# Patient Record
Sex: Female | Born: 1935 | Race: White | Hispanic: No | Smoking: Former smoker
Health system: Southern US, Community
[De-identification: ages and names within clinical notes are randomized; demographics above are authoritative.]

## PROBLEM LIST (undated history)

## (undated) DIAGNOSIS — M129 Arthropathy, unspecified: Secondary | ICD-10-CM

## (undated) DIAGNOSIS — H3530 Unspecified macular degeneration: Secondary | ICD-10-CM

## (undated) DIAGNOSIS — M810 Age-related osteoporosis without current pathological fracture: Secondary | ICD-10-CM

## (undated) DIAGNOSIS — J869 Pyothorax without fistula: Secondary | ICD-10-CM

## (undated) DIAGNOSIS — L409 Psoriasis, unspecified: Secondary | ICD-10-CM

## (undated) DIAGNOSIS — M1990 Unspecified osteoarthritis, unspecified site: Secondary | ICD-10-CM

## (undated) HISTORY — PX: MOUTH SURGERY: SHX715

## (undated) HISTORY — PX: OTHER SURGICAL HISTORY: SHX169

## (undated) HISTORY — DX: Arthropathy, unspecified: M12.9

## (undated) HISTORY — DX: Unspecified macular degeneration: H35.30

## (undated) HISTORY — DX: Age-related osteoporosis without current pathological fracture: M81.0

## (undated) HISTORY — DX: Pyothorax without fistula: J86.9

## (undated) HISTORY — DX: Psoriasis, unspecified: L40.9

## (undated) HISTORY — DX: Unspecified osteoarthritis, unspecified site: M19.90

---

## 1999-01-22 ENCOUNTER — Encounter: Payer: Self-pay | Admitting: Family Medicine

## 1999-01-22 ENCOUNTER — Ambulatory Visit (HOSPITAL_COMMUNITY): Admission: RE | Admit: 1999-01-22 | Discharge: 1999-01-22 | Payer: Self-pay | Admitting: Family Medicine

## 2000-04-01 ENCOUNTER — Ambulatory Visit (HOSPITAL_COMMUNITY): Admission: RE | Admit: 2000-04-01 | Discharge: 2000-04-01 | Payer: Self-pay | Admitting: Family Medicine

## 2000-04-01 ENCOUNTER — Encounter: Payer: Self-pay | Admitting: Family Medicine

## 2001-05-10 ENCOUNTER — Ambulatory Visit (HOSPITAL_COMMUNITY): Admission: RE | Admit: 2001-05-10 | Discharge: 2001-05-10 | Payer: Self-pay | Admitting: Family Medicine

## 2001-05-10 ENCOUNTER — Encounter: Payer: Self-pay | Admitting: Family Medicine

## 2001-05-12 ENCOUNTER — Encounter: Payer: Self-pay | Admitting: Family Medicine

## 2001-05-12 ENCOUNTER — Encounter: Admission: RE | Admit: 2001-05-12 | Discharge: 2001-05-12 | Payer: Self-pay | Admitting: Family Medicine

## 2002-06-13 ENCOUNTER — Other Ambulatory Visit: Admission: RE | Admit: 2002-06-13 | Discharge: 2002-06-13 | Payer: Self-pay | Admitting: Family Medicine

## 2002-06-19 ENCOUNTER — Encounter: Payer: Self-pay | Admitting: Family Medicine

## 2002-06-19 ENCOUNTER — Ambulatory Visit (HOSPITAL_COMMUNITY): Admission: RE | Admit: 2002-06-19 | Discharge: 2002-06-19 | Payer: Self-pay | Admitting: Family Medicine

## 2002-08-27 ENCOUNTER — Encounter: Payer: Self-pay | Admitting: Family Medicine

## 2002-08-27 ENCOUNTER — Encounter: Admission: RE | Admit: 2002-08-27 | Discharge: 2002-08-27 | Payer: Self-pay | Admitting: Family Medicine

## 2003-07-03 ENCOUNTER — Ambulatory Visit (HOSPITAL_COMMUNITY): Admission: RE | Admit: 2003-07-03 | Discharge: 2003-07-03 | Payer: Self-pay | Admitting: Family Medicine

## 2003-07-11 ENCOUNTER — Encounter: Payer: Self-pay | Admitting: Internal Medicine

## 2004-07-14 ENCOUNTER — Encounter: Payer: Self-pay | Admitting: Internal Medicine

## 2004-07-14 ENCOUNTER — Ambulatory Visit (HOSPITAL_COMMUNITY): Admission: RE | Admit: 2004-07-14 | Discharge: 2004-07-14 | Payer: Self-pay | Admitting: Family Medicine

## 2004-08-28 ENCOUNTER — Other Ambulatory Visit: Admission: RE | Admit: 2004-08-28 | Discharge: 2004-08-28 | Payer: Self-pay | Admitting: Family Medicine

## 2005-09-02 ENCOUNTER — Encounter: Payer: Self-pay | Admitting: Internal Medicine

## 2005-09-02 ENCOUNTER — Ambulatory Visit (HOSPITAL_COMMUNITY): Admission: RE | Admit: 2005-09-02 | Discharge: 2005-09-02 | Payer: Self-pay | Admitting: Family Medicine

## 2005-12-06 ENCOUNTER — Encounter: Payer: Self-pay | Admitting: Internal Medicine

## 2006-09-21 ENCOUNTER — Ambulatory Visit (HOSPITAL_COMMUNITY): Admission: RE | Admit: 2006-09-21 | Discharge: 2006-09-21 | Payer: Self-pay | Admitting: Family Medicine

## 2007-02-23 ENCOUNTER — Encounter: Payer: Self-pay | Admitting: Internal Medicine

## 2007-10-19 ENCOUNTER — Ambulatory Visit (HOSPITAL_COMMUNITY): Admission: RE | Admit: 2007-10-19 | Discharge: 2007-10-19 | Payer: Self-pay | Admitting: Family Medicine

## 2008-01-03 ENCOUNTER — Encounter: Payer: Self-pay | Admitting: Internal Medicine

## 2008-12-25 ENCOUNTER — Ambulatory Visit (HOSPITAL_COMMUNITY): Admission: RE | Admit: 2008-12-25 | Discharge: 2008-12-25 | Payer: Self-pay | Admitting: Family Medicine

## 2009-01-16 ENCOUNTER — Encounter: Payer: Self-pay | Admitting: Internal Medicine

## 2009-01-16 LAB — CONVERTED CEMR LAB
Cholesterol: 234 mg/dL
LDL Cholesterol: 130 mg/dL

## 2010-01-29 ENCOUNTER — Encounter: Payer: Self-pay | Admitting: Internal Medicine

## 2010-01-30 ENCOUNTER — Ambulatory Visit (HOSPITAL_COMMUNITY): Admission: RE | Admit: 2010-01-30 | Discharge: 2010-01-30 | Payer: Self-pay | Admitting: Family Medicine

## 2010-01-30 LAB — HM MAMMOGRAPHY

## 2010-02-01 ENCOUNTER — Encounter: Payer: Self-pay | Admitting: Internal Medicine

## 2010-02-01 LAB — CONVERTED CEMR LAB: Cholesterol: 204 mg/dL

## 2010-04-07 DIAGNOSIS — J869 Pyothorax without fistula: Secondary | ICD-10-CM

## 2010-04-07 HISTORY — PX: OTHER SURGICAL HISTORY: SHX169

## 2010-04-07 HISTORY — DX: Pyothorax without fistula: J86.9

## 2010-04-16 ENCOUNTER — Encounter: Admission: RE | Admit: 2010-04-16 | Discharge: 2010-04-16 | Payer: Self-pay | Admitting: Family Medicine

## 2010-04-16 ENCOUNTER — Encounter: Payer: Self-pay | Admitting: Internal Medicine

## 2010-04-16 LAB — CONVERTED CEMR LAB
ALT: 33 units/L
AST: 53 units/L
Albumin: 2.8 g/dL
BUN: 19 mg/dL
CO2: 27 meq/L
Calcium: 9.3 mg/dL
Eosinophils Relative: 0.1 %
Glucose, Bld: 180 mg/dL
Neutrophils Relative %: 86.8 %
RBC: 4.18 M/uL
RDW: 13.8 %
Total Protein: 7 g/dL
WBC: 20 10*3/uL

## 2010-04-17 ENCOUNTER — Encounter: Admission: RE | Admit: 2010-04-17 | Discharge: 2010-04-17 | Payer: Self-pay | Admitting: Family Medicine

## 2010-04-20 ENCOUNTER — Ambulatory Visit: Payer: Self-pay | Admitting: Thoracic Surgery (Cardiothoracic Vascular Surgery)

## 2010-04-20 ENCOUNTER — Inpatient Hospital Stay (HOSPITAL_COMMUNITY)
Admission: AD | Admit: 2010-04-20 | Discharge: 2010-05-10 | Payer: Self-pay | Source: Home / Self Care | Admitting: Internal Medicine

## 2010-04-20 DIAGNOSIS — J189 Pneumonia, unspecified organism: Secondary | ICD-10-CM

## 2010-04-21 ENCOUNTER — Encounter: Payer: Self-pay | Admitting: Thoracic Surgery (Cardiothoracic Vascular Surgery)

## 2010-05-12 ENCOUNTER — Encounter: Admission: RE | Admit: 2010-05-12 | Discharge: 2010-05-12 | Payer: Self-pay | Admitting: Thoracic Surgery

## 2010-05-12 ENCOUNTER — Ambulatory Visit: Payer: Self-pay | Admitting: Thoracic Surgery

## 2010-05-20 ENCOUNTER — Ambulatory Visit: Payer: Self-pay | Admitting: Thoracic Surgery

## 2010-05-29 ENCOUNTER — Encounter: Payer: Self-pay | Admitting: Internal Medicine

## 2010-05-29 LAB — CONVERTED CEMR LAB
ALT: 14 units/L
Alkaline Phosphatase: 64 units/L
BUN: 12 mg/dL
CO2: 29 meq/L
Chloride: 103 meq/L
Eosinophils Relative: 13 %
Glucose, Bld: 113 mg/dL
HCT: 38 %
Hemoglobin: 11.9 g/dL
Lymphocytes, automated: 14 %
Potassium: 4.4 meq/L
Total Bilirubin: 0.2 mg/dL
WBC: 9.4 10*3/uL

## 2010-06-04 ENCOUNTER — Ambulatory Visit (HOSPITAL_COMMUNITY)
Admission: RE | Admit: 2010-06-04 | Discharge: 2010-06-04 | Payer: Self-pay | Source: Home / Self Care | Attending: Thoracic Surgery | Admitting: Thoracic Surgery

## 2010-06-09 ENCOUNTER — Encounter: Payer: Self-pay | Admitting: Internal Medicine

## 2010-06-09 ENCOUNTER — Encounter
Admission: RE | Admit: 2010-06-09 | Discharge: 2010-06-09 | Payer: Self-pay | Source: Home / Self Care | Attending: Thoracic Surgery (Cardiothoracic Vascular Surgery) | Admitting: Thoracic Surgery (Cardiothoracic Vascular Surgery)

## 2010-06-09 ENCOUNTER — Ambulatory Visit
Admission: RE | Admit: 2010-06-09 | Discharge: 2010-06-09 | Payer: Self-pay | Source: Home / Self Care | Attending: Thoracic Surgery (Cardiothoracic Vascular Surgery) | Admitting: Thoracic Surgery (Cardiothoracic Vascular Surgery)

## 2010-06-23 ENCOUNTER — Encounter: Payer: Self-pay | Admitting: Internal Medicine

## 2010-06-24 ENCOUNTER — Ambulatory Visit
Admission: RE | Admit: 2010-06-24 | Discharge: 2010-06-24 | Payer: Self-pay | Source: Home / Self Care | Attending: Internal Medicine | Admitting: Internal Medicine

## 2010-06-24 DIAGNOSIS — M8580 Other specified disorders of bone density and structure, unspecified site: Secondary | ICD-10-CM | POA: Insufficient documentation

## 2010-06-24 DIAGNOSIS — M1990 Unspecified osteoarthritis, unspecified site: Secondary | ICD-10-CM | POA: Insufficient documentation

## 2010-06-24 DIAGNOSIS — Z9189 Other specified personal risk factors, not elsewhere classified: Secondary | ICD-10-CM | POA: Insufficient documentation

## 2010-06-25 ENCOUNTER — Telehealth: Payer: Self-pay | Admitting: Internal Medicine

## 2010-07-06 ENCOUNTER — Encounter: Payer: Self-pay | Admitting: Internal Medicine

## 2010-07-06 DIAGNOSIS — Z872 Personal history of diseases of the skin and subcutaneous tissue: Secondary | ICD-10-CM | POA: Insufficient documentation

## 2010-07-06 DIAGNOSIS — H3530 Unspecified macular degeneration: Secondary | ICD-10-CM | POA: Insufficient documentation

## 2010-07-06 DIAGNOSIS — Z9889 Other specified postprocedural states: Secondary | ICD-10-CM | POA: Insufficient documentation

## 2010-07-09 NOTE — Progress Notes (Signed)
Summary: hydrocodone  ---- Converted from flag ---- ---- 06/25/2010 11:39 AM, Verdell Face wrote: Valentina Gu;  Pt called to let you know that she does take Hydrocodon 1/2 tab as needed for pain it was not on her medication list. Please add to her list. Thank you.   Elnita Maxwell ------------------------------       Additional Follow-up for Phone Call Additional follow up Details #2::    Noted add to med list Follow-up by: Orlan Leavens RMA,  June 25, 2010 11:45 AM  New/Updated Medications: HYDROCODONE-ACETAMINOPHEN 5-325 MG TABS (HYDROCODONE-ACETAMINOPHEN) take 1/2 as needed for pain

## 2010-07-09 NOTE — Assessment & Plan Note (Signed)
Summary: New / Aarp / #/ cd   Vital Signs:  Patient profile:   77 year old female Height:      65 inches (165.10 cm) Weight:      121.12 pounds (55.05 kg) BMI:     20.23 O2 Sat:      98 % on Room air Temp:     98.1 degrees F (36.72 degrees C) oral Pulse rate:   86 / minute BP sitting:   120 / 70  (left arm) Cuff size:   regular  Vitals Entered By: Orlan Leavens RMA (June 24, 2010 9:36 AM)  O2 Flow:  Room air CC: New patient Is Patient Diabetic? No Pain Assessment Patient in pain? no      Comments Req 30 day supply onher evista   Primary Care Provider:  Newt Lukes MD  CC:  New patient.  History of Present Illness: new pt to me and our practice, here to est care - prev followed at [facility]  reviewed prior med issues:  complicated PNA hx -hosp 04/2010 for L empyema - s/p VATS/minithoractomy for drainage and decort, also bronch r/o endobronch lesion x 2 (negative for cancer) complicated by air leak and cont L chest pain - now improved follows with CTS team every 3 mo - cxr improving, f/u ct mar 2012 planned  osteoporosis - on evista for same, compliant with meds and no adv se - no fx hx or bone pain   OA - takes otc as needed for same- no flares, no swelling - pain does not limit participation in Y silver sneakers    Preventive Screening-Counseling & Management  Alcohol-Tobacco     Alcohol drinks/day: 0     Alcohol Counseling: not indicated; patient does not drink     Smoking Status: quit     Year Quit: 1979     Tobacco Counseling: to remain off tobacco products  Caffeine-Diet-Exercise     Does Patient Exercise: yes     Exercise Counseling: not indicated; exercise is adequate     Depression Counseling: not indicated; screening negative for depression  Safety-Violence-Falls     Seat Belt Counseling: not indicated; patient wears seat belts     Helmet Counseling: not applicable     Firearm Counseling: not indicated; uses recommended firearm safety  measures     Violence Counseling: not indicated; no violence risk noted     Fall Risk Counseling: not indicated; no significant falls noted  Clinical Review Panels:  CBC   WBC:  9.4 (05/29/2010)   RBC:  4.44 (05/29/2010)   Hgb:  11.9 (05/29/2010)   Hct:  38.0 (05/29/2010)   Platelets:  346 (05/29/2010)   MCV  85.6 (05/29/2010)   RDW  16.6 (05/29/2010)   PMN:  64 (05/29/2010)   Monos:  9 (05/29/2010)   Eosinophils:  13 (05/29/2010)   Basophil:  0 (05/29/2010)  Complete Metabolic Panel   Glucose:  113 (05/29/2010)   Sodium:  139 (05/29/2010)   Potassium:  4.4 (05/29/2010)   Chloride:  103 (05/29/2010)   CO2:  29 (05/29/2010)   BUN:  12 (05/29/2010)   Creatinine:  0.81 (05/29/2010)   Albumin:  3.1 (05/29/2010)   Total Protein:  7.2 (05/29/2010)   Calcium:  9.3 (05/29/2010)   Total Bili:  0.2 (05/29/2010)   Alk Phos:  64 (05/29/2010)   SGPT (ALT):  14 (05/29/2010)   SGOT (AST):  19 (05/29/2010)   Current Medications (verified): 1)  Evista 60 Mg Tabs (Raloxifene Hcl) .Marland KitchenMarland KitchenMarland Kitchen  Take 1 By Mouth Once Daily 2)  Calcium 500 Mg Tabs (Calcium) .... Take 1 By Mouth Once Daily 3)  Lutein 20 Mg Tabs (Lutein) .... Take 1 By Mouth Once Daily 4)  Xanthin Eye Vitamin .... Take 1 By Mouth Once Daily 5)  Multivitamins  Tabs (Multiple Vitamin) .... Take 1 By Mouth Once Daily 6)  Vitamin D3 1000 Unit Tabs (Cholecalciferol) .... Take 1 By Mouth Once Daily 7)  Fish Oil 1000 Mg Caps (Omega-3 Fatty Acids) .... Take 1 By Mouth Once Daily  Allergies (verified): No Known Drug Allergies  Past History:  Past Medical History: Blood transfusion hx L empyema 04/2010 Osteoarthritis Osteoporosis  Past Surgical History: VATS/minithoracotomy and bronch 04/2010  Family History: Family History Diabetes 1st degree relative (parent) Family History Hypertension (parent) Family History Lung cancer (other relative) Dementia (parent)   Social History: Former Smoker -quit 1979 no alcohol divorced,  lives alone - VERY ACTIVE at [employer], church retired from [employer] 2006 Smoking Status:  quit Does Patient Exercise:  yes  Review of Systems       see HPI above. I have reviewed all other systems and they were negative.   Physical Exam  General:  alert, well-developed, well-nourished, and cooperative to examination.    Head:  Normocephalic and atraumatic without obvious abnormalities. No apparent alopecia or balding. Eyes:  vision grossly intact; pupils equal, round and reactive to light.  conjunctiva and lids normal.   wears glasses Ears:  normal pinnae bilaterally, wears hearing aides Mouth:  teeth and gums in good repair; mucous membranes moist, without lesions or ulcers. oropharynx clear without exudate, no erythema.  Neck:  supple, full ROM, no masses, no thyromegaly; no thyroid nodules or tenderness. no JVD or carotid bruits.   Lungs:  normal respiratory effort, no intercostal retractions or use of accessory muscles; normal breath sounds bilaterally - no crackles and no wheezes.    Heart:  normal rate, regular rhythm, no murmur, and no rub. BLE without edema.  Abdomen:  soft, non-tender, normal bowel sounds, no distention; no masses and no appreciable hepatomegaly or splenomegaly.   Genitalia:  defer Msk:  No deformity or scoliosis noted of thoracic or lumbar spine.   Neurologic:  alert & oriented X3 and cranial nerves II-XII symetrically intact.  strength normal in all extremities, sensation intact to light touch, and gait normal. speech fluent without dysarthria or aphasia; follows commands with good comprehension.  Skin:  no rashes, vesicles, ulcers, or erythema. No nodules or irregularity to palpation. well healed thoractomy scar left lateral chest and chest tube scars x 2 Psych:  very spry - Oriented X3, memory intact for recent and remote, normally interactive, good eye contact, not anxious appearing, not depressed appearing, and not agitated.      Impression &  Recommendations:  Problem # 1:  PNEUMONIA (ICD-486)  complicated course 04/2010 with empyema s/p VATS/minithoracotomy for decort and debridement -  recovering well -  follows with cts (hendrickson and burney) q 3 mo at this time - last ov reviewed - CT 08/2010 planned  pt to call for worsened shortness of breath or new symptoms.   Problem # 2:  OSTEOARTHRITIS (ICD-715.90)  Discussed use of medications, application of heat or cold, and exercises.   Problem # 3:  OSTEOPOROSIS (ICD-733.00) will send for prior records from pcp to review  Her updated medication list for this problem includes:    Evista 60 Mg Tabs (Raloxifene hcl) .Marland Kitchen... Take 1 by mouth once daily  Time spent with patient 35 minutes, more than 50% of this time was spent counseling patient on complicated hosp course 04/2010 and preceeding illness as well as review of current meds/refills - will send for records from former pcp  Complete Medication List: 1)  Evista 60 Mg Tabs (Raloxifene hcl) .... Take 1 by mouth once daily 2)  Calcium 500 Mg Tabs (Calcium) .... Take 1 by mouth once daily 3)  Lutein 20 Mg Tabs (Lutein) .... Take 1 by mouth once daily 4)  Xanthin Eye Vitamin  .... Take 1 by mouth once daily 5)  Multivitamins Tabs (Multiple vitamin) .... Take 1 by mouth once daily 6)  Vitamin D3 1000 Unit Tabs (Cholecalciferol) .... Take 1 by mouth once daily 7)  Fish Oil 1000 Mg Caps (Omega-3 fatty acids) .... Take 1 by mouth once daily  Patient Instructions: 1)  it was good to see you today.  2)  medical history reviewed in depth today - no changes recommended - refill on evista as requested 3)  will send for records from [place] 4)  Please schedule a follow-up appointment when due for your physical, call sooner if problems.  Prescriptions: EVISTA 60 MG TABS (RALOXIFENE HCL) take 1 by mouth once daily  #90 x 4   Entered and Authorized by:   Newt Lukes MD   Signed by:   Newt Lukes MD on 06/24/2010   Method  used:   Electronically to        [facility] (retail)       [address]       [place], [place]  [id number]       Ph: [phone]       Fax: [phone]   RxID:   [id number]    Orders Added: 1)  New Patient Level III [02725]

## 2010-07-09 NOTE — Letter (Signed)
Summary: Jovita Gamma MD  Jovita Gamma MD   Imported By: Lennie Odor 06/29/2010 11:34:34  _____________________________________________________________________  External Attachment:    Type:   Image     Comment:   External Document

## 2010-07-23 NOTE — Letter (Signed)
Summary: [facility]   Imported By: Sherian Rein 07/17/2010 10:11:57  _____________________________________________________________________  External Attachment:    Type:   Image     Comment:   External Document

## 2010-07-23 NOTE — Procedures (Signed)
Summary: Colonoscopy/[facility]  Colonoscopy/[facility]   Imported By: Sherian Rein 07/17/2010 10:17:54  _____________________________________________________________________  External Attachment:    Type:   Image     Comment:   External Document

## 2010-07-23 NOTE — Letter (Signed)
Summary: [facility]   Imported By: Sherian Rein 07/17/2010 10:10:59  _____________________________________________________________________  External Attachment:    Type:   Image     Comment:   External Document

## 2010-08-17 ENCOUNTER — Other Ambulatory Visit: Payer: Self-pay | Admitting: Internal Medicine

## 2010-08-17 ENCOUNTER — Ambulatory Visit (INDEPENDENT_AMBULATORY_CARE_PROVIDER_SITE_OTHER): Payer: Medicare Other | Admitting: Internal Medicine

## 2010-08-17 ENCOUNTER — Other Ambulatory Visit: Payer: Medicare Other

## 2010-08-17 ENCOUNTER — Encounter: Payer: Self-pay | Admitting: Internal Medicine

## 2010-08-17 DIAGNOSIS — L659 Nonscarring hair loss, unspecified: Secondary | ICD-10-CM

## 2010-08-17 DIAGNOSIS — Z79899 Other long term (current) drug therapy: Secondary | ICD-10-CM

## 2010-08-17 DIAGNOSIS — H3530 Unspecified macular degeneration: Secondary | ICD-10-CM

## 2010-08-17 LAB — CBC
MCH: 26.8 pg (ref 26.0–34.0)
MCV: 85.6 fL (ref 78.0–100.0)
Platelets: 346 10*3/uL (ref 150–400)
RDW: 16.6 % — ABNORMAL HIGH (ref 11.5–15.5)
WBC: 9.4 10*3/uL (ref 4.0–10.5)

## 2010-08-17 LAB — SURGICAL PCR SCREEN: Staphylococcus aureus: NEGATIVE

## 2010-08-17 LAB — CBC WITH DIFFERENTIAL/PLATELET
Basophils Relative: 0.5 % (ref 0.0–3.0)
HCT: 39.3 % (ref 36.0–46.0)
Hemoglobin: 13.4 g/dL (ref 12.0–15.0)
MCHC: 34.1 g/dL (ref 30.0–36.0)
MCV: 82.4 fl (ref 78.0–100.0)
Neutro Abs: 4.9 10*3/uL (ref 1.4–7.7)
Neutrophils Relative %: 70.3 % (ref 43.0–77.0)
Platelets: 270 10*3/uL (ref 150.0–400.0)
RDW: 15.9 % — ABNORMAL HIGH (ref 11.5–14.6)

## 2010-08-17 LAB — COMPREHENSIVE METABOLIC PANEL
BUN: 12 mg/dL (ref 6–23)
Chloride: 103 mEq/L (ref 96–112)
GFR calc Af Amer: >60 mL/min (ref >60)
GFR calc non Af Amer: >60 mL/min (ref >60)
Potassium: 4.4 mEq/L (ref 3.5–5.1)

## 2010-08-17 LAB — DIFFERENTIAL
Eosinophils Absolute: 1.2 10*3/uL — ABNORMAL HIGH (ref 0.0–0.7)
Eosinophils Relative: 13 % — ABNORMAL HIGH (ref 0–5)
Lymphocytes Relative: 14 % (ref 12–46)
Monocytes Absolute: 0.9 10*3/uL (ref 0.1–1.0)
Monocytes Relative: 9 % (ref 3–12)
Neutrophils Relative %: 64 % (ref 43–77)

## 2010-08-18 LAB — GLUCOSE, CAPILLARY
Glucose-Capillary: 107 mg/dL — ABNORMAL HIGH (ref 70–99)
Glucose-Capillary: 111 mg/dL — ABNORMAL HIGH (ref 70–99)
Glucose-Capillary: 112 mg/dL — ABNORMAL HIGH (ref 70–99)
Glucose-Capillary: 117 mg/dL — ABNORMAL HIGH (ref 70–99)
Glucose-Capillary: 118 mg/dL — ABNORMAL HIGH (ref 70–99)
Glucose-Capillary: 119 mg/dL — ABNORMAL HIGH (ref 70–99)
Glucose-Capillary: 119 mg/dL — ABNORMAL HIGH (ref 70–99)
Glucose-Capillary: 119 mg/dL — ABNORMAL HIGH (ref 70–99)
Glucose-Capillary: 120 mg/dL — ABNORMAL HIGH (ref 70–99)
Glucose-Capillary: 121 mg/dL — ABNORMAL HIGH (ref 70–99)
Glucose-Capillary: 122 mg/dL — ABNORMAL HIGH (ref 70–99)
Glucose-Capillary: 125 mg/dL — ABNORMAL HIGH (ref 70–99)
Glucose-Capillary: 125 mg/dL — ABNORMAL HIGH (ref 70–99)
Glucose-Capillary: 127 mg/dL — ABNORMAL HIGH (ref 70–99)
Glucose-Capillary: 129 mg/dL — ABNORMAL HIGH (ref 70–99)
Glucose-Capillary: 134 mg/dL — ABNORMAL HIGH (ref 70–99)
Glucose-Capillary: 136 mg/dL — ABNORMAL HIGH (ref 70–99)
Glucose-Capillary: 141 mg/dL — ABNORMAL HIGH (ref 70–99)
Glucose-Capillary: 145 mg/dL — ABNORMAL HIGH (ref 70–99)
Glucose-Capillary: 149 mg/dL — ABNORMAL HIGH (ref 70–99)
Glucose-Capillary: 149 mg/dL — ABNORMAL HIGH (ref 70–99)
Glucose-Capillary: 149 mg/dL — ABNORMAL HIGH (ref 70–99)
Glucose-Capillary: 183 mg/dL — ABNORMAL HIGH (ref 70–99)
Glucose-Capillary: 195 mg/dL — ABNORMAL HIGH (ref 70–99)
Glucose-Capillary: 93 mg/dL (ref 70–99)

## 2010-08-18 LAB — COMPREHENSIVE METABOLIC PANEL
ALT: 14 U/L (ref 0–35)
ALT: 21 U/L (ref 0–35)
ALT: 52 U/L — ABNORMAL HIGH (ref 0–35)
AST: 19 U/L (ref 0–37)
AST: 116 U/L — ABNORMAL HIGH (ref 0–37)
AST: 24 U/L (ref 0–37)
AST: 53 U/L — ABNORMAL HIGH (ref 0–37)
Albumin: 2.2 g/dL — ABNORMAL LOW (ref 3.5–5.2)
Albumin: 1.5 g/dL — ABNORMAL LOW (ref 3.5–5.2)
Albumin: 1.6 g/dL — ABNORMAL LOW (ref 3.5–5.2)
Alkaline Phosphatase: 71 U/L (ref 39–117)
Alkaline Phosphatase: 110 U/L (ref 39–117)
Alkaline Phosphatase: 52 U/L (ref 39–117)
Alkaline Phosphatase: 79 U/L (ref 39–117)
BUN: 10 mg/dL (ref 6–23)
BUN: 2 mg/dL — ABNORMAL LOW (ref 6–23)
CO2: 26 mEq/L (ref 19–32)
CO2: 27 mEq/L (ref 19–32)
CO2: 29 mEq/L (ref 19–32)
CO2: 30 mEq/L (ref 19–32)
CO2: 30 mEq/L (ref 19–32)
Chloride: 98 mEq/L (ref 96–112)
Chloride: 104 mEq/L (ref 96–112)
Chloride: 95 mEq/L — ABNORMAL LOW (ref 96–112)
Chloride: 96 mEq/L (ref 96–112)
Chloride: 97 mEq/L (ref 96–112)
Creatinine, Ser: 0.67 mg/dL (ref 0.4–1.2)
Creatinine, Ser: 0.83 mg/dL (ref 0.4–1.2)
GFR calc Af Amer: >60 mL/min (ref >60)
GFR calc Af Amer: >60 mL/min (ref >60)
GFR calc Af Amer: >60 mL/min (ref >60)
GFR calc non Af Amer: >60 mL/min (ref >60)
GFR calc non Af Amer: >60 mL/min (ref >60)
Glucose, Bld: 126 mg/dL — ABNORMAL HIGH (ref 70–99)
Glucose, Bld: 131 mg/dL — ABNORMAL HIGH (ref 70–99)
Potassium: 3.8 mEq/L (ref 3.5–5.1)
Potassium: 3.2 mEq/L — ABNORMAL LOW (ref 3.5–5.1)
Potassium: 3.4 mEq/L — ABNORMAL LOW (ref 3.5–5.1)
Potassium: 4.2 mEq/L (ref 3.5–5.1)
Sodium: 137 mEq/L (ref 135–145)
Sodium: 132 mEq/L — ABNORMAL LOW (ref 135–145)
Sodium: 135 mEq/L (ref 135–145)
Total Bilirubin: 0.3 mg/dL (ref 0.3–1.2)
Total Bilirubin: 0.5 mg/dL (ref 0.3–1.2)
Total Bilirubin: 0.5 mg/dL (ref 0.3–1.2)
Total Bilirubin: 0.8 mg/dL (ref 0.3–1.2)

## 2010-08-18 LAB — CULTURE, BLOOD (ROUTINE X 2)
Culture  Setup Time: 201111142311
Culture  Setup Time: 201111142312

## 2010-08-18 LAB — CBC
HCT: 24.7 % — ABNORMAL LOW (ref 36.0–46.0)
HCT: 24.7 % — ABNORMAL LOW (ref 36.0–46.0)
HCT: 26.3 % — ABNORMAL LOW (ref 36.0–46.0)
HCT: 26.4 % — ABNORMAL LOW (ref 36.0–46.0)
HCT: 30.2 % — ABNORMAL LOW (ref 36.0–46.0)
HCT: 30.3 % — ABNORMAL LOW (ref 36.0–46.0)
Hemoglobin: 9.4 g/dL — ABNORMAL LOW (ref 12.0–15.0)
Hemoglobin: 8.1 g/dL — ABNORMAL LOW (ref 12.0–15.0)
Hemoglobin: 8.8 g/dL — ABNORMAL LOW (ref 12.0–15.0)
Hemoglobin: 8.9 g/dL — ABNORMAL LOW (ref 12.0–15.0)
Hemoglobin: 8.9 g/dL — ABNORMAL LOW (ref 12.0–15.0)
Hemoglobin: 9.8 g/dL — ABNORMAL LOW (ref 12.0–15.0)
Hemoglobin: 9.9 g/dL — ABNORMAL LOW (ref 12.0–15.0)
MCH: 27.5 pg (ref 26.0–34.0)
MCH: 27.8 pg (ref 26.0–34.0)
MCH: 27.9 pg (ref 26.0–34.0)
MCH: 28.3 pg (ref 26.0–34.0)
MCH: 28.6 pg (ref 26.0–34.0)
MCH: 28.6 pg (ref 26.0–34.0)
MCHC: 32.8 g/dL (ref 30.0–36.0)
MCHC: 32.8 g/dL (ref 30.0–36.0)
MCHC: 33.5 g/dL (ref 30.0–36.0)
MCHC: 33.7 g/dL (ref 30.0–36.0)
MCHC: 33.7 g/dL (ref 30.0–36.0)
MCV: 83.3 fL (ref 78.0–100.0)
MCV: 83.7 fL (ref 78.0–100.0)
MCV: 84.9 fL (ref 78.0–100.0)
MCV: 84.9 fL (ref 78.0–100.0)
MCV: 85.2 fL (ref 78.0–100.0)
Platelets: 439 10*3/uL — ABNORMAL HIGH (ref 150–400)
Platelets: 239 10*3/uL (ref 150–400)
Platelets: 273 10*3/uL (ref 150–400)
Platelets: 285 10*3/uL (ref 150–400)
Platelets: 296 10*3/uL (ref 150–400)
Platelets: 426 10*3/uL — ABNORMAL HIGH (ref 150–400)
Platelets: 500 10*3/uL — ABNORMAL HIGH (ref 150–400)
RBC: 3.41 MIL/uL — ABNORMAL LOW (ref 3.87–5.11)
RBC: 2.9 MIL/uL — ABNORMAL LOW (ref 3.87–5.11)
RBC: 2.91 MIL/uL — ABNORMAL LOW (ref 3.87–5.11)
RBC: 3.11 MIL/uL — ABNORMAL LOW (ref 3.87–5.11)
RBC: 3.11 MIL/uL — ABNORMAL LOW (ref 3.87–5.11)
RBC: 3.13 MIL/uL — ABNORMAL LOW (ref 3.87–5.11)
RBC: 3.62 MIL/uL — ABNORMAL LOW (ref 3.87–5.11)
RDW: 15 % (ref 11.5–15.5)
RDW: 15 % (ref 11.5–15.5)
RDW: 15 % (ref 11.5–15.5)
RDW: 15.2 % (ref 11.5–15.5)
WBC: 9 10*3/uL (ref 4.0–10.5)
WBC: 11.8 10*3/uL — ABNORMAL HIGH (ref 4.0–10.5)
WBC: 30.6 10*3/uL — ABNORMAL HIGH (ref 4.0–10.5)
WBC: 8.8 10*3/uL (ref 4.0–10.5)
WBC: 8.8 10*3/uL (ref 4.0–10.5)
WBC: 9.7 10*3/uL (ref 4.0–10.5)

## 2010-08-18 LAB — BASIC METABOLIC PANEL
BUN: 3 mg/dL — ABNORMAL LOW (ref 6–23)
BUN: 4 mg/dL — ABNORMAL LOW (ref 6–23)
BUN: 6 mg/dL (ref 6–23)
CO2: 26 mEq/L (ref 19–32)
CO2: 27 mEq/L (ref 19–32)
CO2: 28 mEq/L (ref 19–32)
CO2: 30 mEq/L (ref 19–32)
Calcium: 7.3 mg/dL — ABNORMAL LOW (ref 8.4–10.5)
Calcium: 7.7 mg/dL — ABNORMAL LOW (ref 8.4–10.5)
Calcium: 7.8 mg/dL — ABNORMAL LOW (ref 8.4–10.5)
Calcium: 8.1 mg/dL — ABNORMAL LOW (ref 8.4–10.5)
Chloride: 101 mEq/L (ref 96–112)
Chloride: 98 mEq/L (ref 96–112)
Chloride: 98 mEq/L (ref 96–112)
Creatinine, Ser: 0.67 mg/dL (ref 0.4–1.2)
Creatinine, Ser: 0.72 mg/dL (ref 0.4–1.2)
Creatinine, Ser: 0.73 mg/dL (ref 0.4–1.2)
Creatinine, Ser: 0.81 mg/dL (ref 0.4–1.2)
Creatinine, Ser: 0.84 mg/dL (ref 0.4–1.2)
GFR calc Af Amer: >60 mL/min (ref >60)
GFR calc Af Amer: >60 mL/min (ref >60)
GFR calc Af Amer: >60 mL/min (ref >60)
GFR calc non Af Amer: >60 mL/min (ref >60)
GFR calc non Af Amer: >60 mL/min (ref >60)
GFR calc non Af Amer: >60 mL/min (ref >60)
Glucose, Bld: 113 mg/dL — ABNORMAL HIGH (ref 70–99)
Glucose, Bld: 125 mg/dL — ABNORMAL HIGH (ref 70–99)
Glucose, Bld: 323 mg/dL — ABNORMAL HIGH (ref 70–99)
Glucose, Bld: 92 mg/dL (ref 70–99)
Potassium: 3.7 mEq/L (ref 3.5–5.1)
Potassium: 4 mEq/L (ref 3.5–5.1)
Sodium: 131 mEq/L — ABNORMAL LOW (ref 135–145)
Sodium: 136 mEq/L (ref 135–145)
Sodium: 137 mEq/L (ref 135–145)

## 2010-08-18 LAB — DIFFERENTIAL
Basophils Absolute: 0 10*3/uL (ref 0.0–0.1)
Basophils Relative: 0 % (ref 0–1)
Eosinophils Absolute: 0.1 10*3/uL (ref 0.0–0.7)
Monocytes Absolute: 1.5 10*3/uL — ABNORMAL HIGH (ref 0.1–1.0)
Monocytes Relative: 12 % (ref 3–12)

## 2010-08-18 LAB — POCT I-STAT 3, ART BLOOD GAS (G3+)
Acid-Base Excess: 2 mmol/L (ref 0.0–2.0)
Acid-Base Excess: 4 mmol/L — ABNORMAL HIGH (ref 0.0–2.0)
Bicarbonate: 26.9 mEq/L — ABNORMAL HIGH (ref 20.0–24.0)
O2 Saturation: 99 %
pCO2 arterial: 39.3 mmHg (ref 35.0–45.0)
pO2, Arterial: 115 mmHg — ABNORMAL HIGH (ref 80.0–100.0)
pO2, Arterial: 121 mmHg — ABNORMAL HIGH (ref 80.0–100.0)

## 2010-08-18 LAB — BODY FLUID CULTURE: Gram Stain: NONE SEEN

## 2010-08-18 LAB — URINALYSIS, MICROSCOPIC ONLY
Bilirubin Urine: NEGATIVE
Glucose, UA: NEGATIVE mg/dL
Hgb urine dipstick: NEGATIVE
Ketones, ur: NEGATIVE mg/dL
Protein, ur: NEGATIVE mg/dL
Urobilinogen, UA: 0.2 mg/dL (ref 0.0–1.0)

## 2010-08-18 LAB — TYPE AND SCREEN: Unit division: 0

## 2010-08-18 LAB — CULTURE, RESPIRATORY W GRAM STAIN

## 2010-08-18 LAB — LACTIC ACID, PLASMA: Lactic Acid, Venous: 1.4 mmol/L (ref 0.5–2.2)

## 2010-08-18 LAB — TISSUE CULTURE

## 2010-08-18 LAB — VANCOMYCIN, TROUGH: Vancomycin Tr: 10.1 ug/mL (ref 10.0–20.0)

## 2010-08-18 LAB — APTT
aPTT: 36 seconds (ref 24–37)
aPTT: 38 seconds — ABNORMAL HIGH (ref 24–37)

## 2010-08-18 LAB — HEMOGLOBIN A1C: Mean Plasma Glucose: 140 mg/dL — ABNORMAL HIGH (ref <117)

## 2010-08-25 NOTE — Assessment & Plan Note (Signed)
Summary: HAIR LOSS   STC   Vital Signs:  Patient profile:   77 year old female Weight:      127.2 pounds (57.82 kg) O2 Sat:      97 % on Room air Temp:     97.6 degrees F (36.44 degrees C) oral Pulse rate:   75 / minute BP sitting:   118 / 62  (left arm) Cuff size:   regular  Vitals Entered By: Orlan Leavens RMA (August 17, 2010 11:41 AM)  O2 Flow:  Room air CC: HAIR LOSS Is Patient Diabetic? No Pain Assessment Patient in pain? no        Primary Care Provider:  Newt Lukes MD  CC:  HAIR LOSS.  History of Present Illness: c/o hair loss predominately affects scalp at crown - no other body hair affected (sparse at baseline) no scalp pain no recent hair trauma (no perm/color) but uses thermal method of hair setting also with nail dryness - ?thyroid problem or other vit defic +physical and eotional stressors  reviewed prior med issues:  complicated PNA hx -hosp 04/2010 for L empyema - s/p VATS/minithoractomy for drainage and decort, also bronch r/o endobronch lesion x 2 (negative for cancer) complicated by air leak and cont L chest pain - now improved follows with CTS team every 3 mo - cxr improving, f/u ct mar 2012 planned  osteoporosis - on evista for same, compliant with meds and no adv se - no fx hx or bone pain   OA - takes otc as needed for same- no flares, no swelling - pain does not limit participation in Y silver sneakers    Clinical Review Panels:  Lipid Management   Cholesterol:  204 (02/01/2010)   LDL (bad choesterol):  99 (02/01/2010)   HDL (good cholesterol):  79 (02/01/2010)   Triglycerides:  74 (02/01/2010)  CBC   WBC:  9.4 (05/29/2010)   RBC:  4.44 (05/29/2010)   Hgb:  11.9 (05/29/2010)   Hct:  38.0 (05/29/2010)   Platelets:  346 (05/29/2010)   MCV  85.6 (05/29/2010)   RDW  16.6 (05/29/2010)   PMN:  64 (05/29/2010)   Monos:  9 (05/29/2010)   Eosinophils:  13 (05/29/2010)   Basophil:  0 (05/29/2010)  Complete Metabolic Panel  Glucose:  113 (05/29/2010)   Sodium:  139 (05/29/2010)   Potassium:  4.4 (05/29/2010)   Chloride:  103 (05/29/2010)   CO2:  29 (05/29/2010)   BUN:  12 (05/29/2010)   Creatinine:  0.81 (05/29/2010)   Albumin:  3.1 (05/29/2010)   Total Protein:  7.2 (05/29/2010)   Calcium:  9.3 (05/29/2010)   Total Bili:  0.2 (05/29/2010)   Alk Phos:  64 (05/29/2010)   SGPT (ALT):  14 (05/29/2010)   SGOT (AST):  19 (05/29/2010)   Current Medications (verified): 1)  Evista 60 Mg Tabs (Raloxifene Hcl) .... Take 1 By Mouth Once Daily 2)  Calcium 500 Mg Tabs (Calcium) .... Take 1 By Mouth Once Daily 3)  Lutein 20 Mg Tabs (Lutein) .... Take 1 By Mouth Once Daily 4)  Xanthin Eye Vitamin .... Take 1 By Mouth Once Daily 5)  Multivitamins  Tabs (Multiple Vitamin) .... Take 1 By Mouth Once Daily 6)  Vitamin D3 1000 Unit Tabs (Cholecalciferol) .... Take 1 By Mouth Once Daily 7)  Fish Oil 1000 Mg Caps (Omega-3 Fatty Acids) .... Take 1 By Mouth Once Daily 8)  Hydrocodone-Acetaminophen 5-325 Mg Tabs (Hydrocodone-Acetaminophen) .... Take 1/2 As Needed For Pain  Allergies (verified): 1)  ! * Hrt  Past History:  Past Medical History: L empyema 04/2010 Osteoarthritis Osteoporosis macular degenration  MD roster: optho - [place]  Review of Systems  The patient denies fever, weight loss, chest pain, and headaches.    Physical Exam  General:  alert, well-developed, well-nourished, and cooperative to examination.    Lungs:  normal respiratory effort, no intercostal retractions or use of accessory muscles; normal breath sounds bilaterally - no crackles and no wheezes.    Heart:  normal rate, regular rhythm, no murmur, and no rub. BLE without edema.  Skin:  thinning hair at scalp but no balding - no scalp inflammation or ulceration/redness or tenderness Psych:  very spry - Oriented X3, memory intact for recent and remote, normally interactive, good eye contact, not anxious appearing, not depressed appearing,  and not agitated.      Impression & Recommendations:  Problem # 1:  HAIR LOSS (ICD-704.00) suspect stress related - check labs now rec to avoid hair stressors such as perm/color at this time Orders: TLB-TSH (Thyroid Stimulating Hormone) (84443-TSH) TLB-CBC Platelet - w/Differential (85025-CBCD) TLB-B12 + Folate Pnl (62130_86578-I69/GEX)  Problem # 2:  MACULAR DEGENERATION (ICD-362.50) follows with optho for same - no clinical change  Complete Medication List: 1)  Evista 60 Mg Tabs (Raloxifene hcl) .... Take 1 by mouth once daily 2)  Calcium 500 Mg Tabs (Calcium) .... Take 1 by mouth once daily 3)  Lutein 20 Mg Tabs (Lutein) .... Take 1 by mouth once daily 4)  Xanthin Eye Vitamin  .... Take 1 by mouth once daily 5)  Multivitamins Tabs (Multiple vitamin) .... Take 1 by mouth once daily 6)  Vitamin D3 1000 Unit Tabs (Cholecalciferol) .... Take 1 by mouth once daily 7)  Fish Oil 1000 Mg Caps (Omega-3 fatty acids) .... Take 1 by mouth once daily 8)  Hydrocodone-acetaminophen 5-325 Mg Tabs (Hydrocodone-acetaminophen) .... Take 1/2 as needed for pain 9)  Biotin 5 Mg Tabs (Biotin) .Marland Kitchen.. 1 by mouth once daily 10)  Evening Primrose Oil 1000 Mg Caps (Evening primrose oil) .Marland Kitchen.. 1 by mouth once daily  Patient Instructions: 1)  it was good to see you today. 2)  test(s) ordered today - your results will be called to you after review in 48-72 hours from the time of test completion 3)  Please keep schedule follow-up appointment when due for your physical, call sooner if problems.    Orders Added: 1)  TLB-TSH (Thyroid Stimulating Hormone) [84443-TSH] 2)  TLB-CBC Platelet - w/Differential [85025-CBCD] 3)  TLB-B12 + Folate Pnl [82746_82607-B12/FOL] 4)  Est. Patient Level IV [52841]

## 2010-09-04 ENCOUNTER — Other Ambulatory Visit: Payer: Self-pay | Admitting: Thoracic Surgery (Cardiothoracic Vascular Surgery)

## 2010-09-04 DIAGNOSIS — J869 Pyothorax without fistula: Secondary | ICD-10-CM

## 2010-09-07 ENCOUNTER — Encounter (INDEPENDENT_AMBULATORY_CARE_PROVIDER_SITE_OTHER): Payer: Medicare Other | Admitting: Thoracic Surgery (Cardiothoracic Vascular Surgery)

## 2010-09-07 ENCOUNTER — Other Ambulatory Visit: Payer: Self-pay | Admitting: Thoracic Surgery (Cardiothoracic Vascular Surgery)

## 2010-09-07 ENCOUNTER — Ambulatory Visit
Admission: RE | Admit: 2010-09-07 | Discharge: 2010-09-07 | Disposition: A | Discharge status: Home / Self Care | Payer: Medicare Other | Source: Ambulatory Visit | Attending: Thoracic Surgery (Cardiothoracic Vascular Surgery) | Admitting: Thoracic Surgery (Cardiothoracic Vascular Surgery)

## 2010-09-07 DIAGNOSIS — J869 Pyothorax without fistula: Secondary | ICD-10-CM

## 2010-09-08 NOTE — Assessment & Plan Note (Signed)
OFFICE VISIT  [name], [name] DOB:  1933-07-06                                        September 07, 2010 CHART #:  [id number]  Ms. Terry Robbins is a 77 year old woman who in November was treated for a left empyema and severe pneumonia.  She had a CT done at that time, which question whether might be an endobronchial mass lesion.  On bronchoscopy, no mass was seen.  Biopsies were negative.  She had a followup film in January 2012, when came back for postoperative followup.  At that time, there was still some residual scarring left from her surgery.  She now returns with a repeat chest x-ray.  She says she has been feeling well.  The only issue she has had is she still has some pain up onto the left breast especially when she sits and bends little bit.  She also had some hair loss and seeing Dr. Valentina Lucks about that.  Her thyroid functions were normal.  It is unclear what is causing her hair loss.  Other than that, she has been doing well.  She is planning to travel to [place] soon.  On exam, Ms. Terry Robbins is a well-appearing 77 year old woman in no acute distress.  Incisions are well healed.  Lungs have equal breath sounds bilaterally.  Her chest x-ray shows continued improvement of some residual scarring in the left lung.  IMPRESSION:  Ms. Terry Robbins is doing well at this point in time.  She is now about 5 months out from her decortication and very pleased with how she is doing.  She has minimal discomfort and really no symptoms referable to her respiratory system.  She does have some residual scarring, which would be expected with pneumonia and decortication procedure, but I do think it would be worthwhile to do a repeat CT scan in 6 weeks.  It will be about 6 months from her original scan just to be 100% sure that there is not a small cancer present.  I will see her back in the office at that time.  Salvatore Decent Dorris Fetch, M.D. Electronically Signed  SCH/MEDQ   D:  09/07/2010  T:  09/08/2010  Job:  [id number]  cc:   Gretta Arab. Valentina Lucks, M.D.

## 2010-10-20 ENCOUNTER — Ambulatory Visit
Admission: RE | Admit: 2010-10-20 | Discharge: 2010-10-20 | Disposition: A | Discharge status: Home / Self Care | Payer: Medicare Other | Source: Ambulatory Visit | Attending: Thoracic Surgery (Cardiothoracic Vascular Surgery) | Admitting: Thoracic Surgery (Cardiothoracic Vascular Surgery)

## 2010-10-20 ENCOUNTER — Encounter: Payer: Medicare Other | Admitting: Thoracic Surgery (Cardiothoracic Vascular Surgery)

## 2010-10-20 NOTE — Assessment & Plan Note (Signed)
OFFICE VISIT   FOSTER, SONNIER  DOB:  05-28-1934                                        June 09, 2010  CHART #:  [id number]   REASON FOR VISIT:  Followup after decortication.   HISTORY OF PRESENT ILLNESS:  The patient is a 77 year old woman who  presented back in November with left-sided chest pain, fevers and  chills.  She had a complex left-sided effusion.  She underwent  bronchoscopy, left VATS, minithoracotomy, and decortication on April 21, 2010.  On bronchoscopy, there was a question of an endobronchial  tumor but no mass was seen.  Biopsies were taken.  There was no evidence  of cancer.  Postoperatively, she had a prolonged air leak.  She was seen  in consultation by Dr. Karle Plumber who placed three intrabronchial  valves.  Ultimately, her chest tubes were removed and the endobronchial  valves were removed as well.  She now returns for a followup visit.  She  was last seen in the office on May 20, 2010, by Dr. Edwyna Shell, at  which time she was doing well.  The patient says that she continues to  have some pain and tightness along the anterior lower ribcage on the  left side.  She has not had any shortness of breath.  She denies any  cough or hemoptysis.  She has not had any fevers or chills.  She says  she is eating normally and her bowels are functioning normally.  She is  anxious to restart her SilverSneakers exercise program.   CURRENT MEDICATIONS:  1. Antioxidant 1 tablet daily.  2. Calcium carbonate 1 tablet daily.  3. Evista 60 mg daily.  4. Fish oil 1000 mg daily.  5. Lutein 20 mg daily.  6. Multivitamin 1 daily.  7. Xanthin eye vitamin 1 tablet daily.   ALLERGIES:  She has no known drug allergies.   PHYSICAL EXAMINATION:  The patient is a 77 year old woman in no acute  distress.  Blood pressure is 136/78, pulse 90, respirations are 18,  oxygen saturation is 97% on room air.  Lungs have slightly diminished  breath sounds on  the left side but otherwise are clear bilaterally.  Her  incisions are well healed.   IMAGING:  Chest x-ray shows continuing improvement in aeration in the  left lung.  There was some residual pleural thickening and scarring,  some slight elevation of the left hemidiaphragm.  Pulmonary  consolidation continues to improve.   IMPRESSION:  The patient is a 77 year old woman status post  decortication for an empyema.  This was complicated by prolonged air  leak requiring placement of endobronchial valves which were successful  in resolving the air leak.  She is really doing quite well at this point  in time.  I encouraged her to increase her activities as she plans but  cautioned her to go about this gradually and not try and overdo it all  at once.  I did reassure that it is not uncommon to be having pain at  this point in time and I would not be surprised if that does not persist  for several more months given the severity of her inflammatory process  in the chest and the extensiveness of the decortication.  I want to see  her back in about 3 months.  We  will check another chest x-ray, want to  see that this lung parenchyma continues to clear.  There was a question  on her CT scan of a possible endobronchial mass but none was seen at the  time of bronchoscopy, and I want to make sure that issue is completely  resolved.   Salvatore Decent Dorris Fetch, M.D.  Electronically Signed   SCH/MEDQ  D:  06/09/2010  T:  06/10/2010  Job:  [id number]   cc:   Gretta Arab. Valentina Lucks, M.D.  Ines Bloomer, M.D.

## 2010-10-20 NOTE — Assessment & Plan Note (Signed)
OFFICE VISIT   MISAKO, ROEDER B  DOB:  [birth year]                                        May 20, 2010  CHART #:  [id number]   Blood pressure was 132/78, pulse 80, respirations 20, and sats were 97%.  She was sent in by the home health nurse for I do not know what reason.  Apparently, the nurse thought she heard something in her lungs.   Her lungs are clear bilaterally.  The incisions are well healed over the  scapula.  There is some mild induration which you usually see over the  scapula.  I plan to remove her valves on the 29th as scheduled.   Ines Bloomer, M.D.  Electronically Signed   DPB/MEDQ  D:  05/20/2010  T:  05/20/2010  Job:  [id number]

## 2010-10-20 NOTE — Letter (Signed)
May 12, 2010   Gretta Arab. Valentina Lucks, MD  [address]  [place], [place] [address]   Re:  SURIYAH, VERGARA               DOB:  1933-12-04   Dear Dr. Valentina Lucks:   I saw patient back today.  She had an empyema done on April 21, 2010,  and had a prolonged persistent air leak such that we had to put an  endobronchial valve on May 06, 2010.  She closed the air leak  within about 2 days after we replaced the valve.  She came back today  and her chest x-ray shows normal postoperative changes in her left lower  lobe.  No evidence of recurrence of any air leak, removed the chest tube  sutures, told her gradually increase her activities.  Her blood pressure  is 126/86, pulse 100, respirations 18, sats were 97%.  We plan to bring  her back on Thursday, the 29th for an outpatient fiberoptic bronchoscopy  to remove her endobronchial valve.  I appreciate the opportunity of  seeing Ms. Mort.   Sincerely,   Ines Bloomer, M.D.  Electronically Signed   DPB/MEDQ  D:  05/12/2010  T:  05/13/2010  Job:  [id number]   cc:   Salvatore Decent. Dorris Fetch, M.D.

## 2010-10-23 ENCOUNTER — Encounter (INDEPENDENT_AMBULATORY_CARE_PROVIDER_SITE_OTHER): Payer: Medicare Other | Admitting: Thoracic Surgery (Cardiothoracic Vascular Surgery)

## 2010-10-27 ENCOUNTER — Encounter: Payer: Medicare Other | Admitting: Thoracic Surgery (Cardiothoracic Vascular Surgery)

## 2010-10-28 ENCOUNTER — Encounter (INDEPENDENT_AMBULATORY_CARE_PROVIDER_SITE_OTHER): Payer: Medicare Other | Admitting: Thoracic Surgery (Cardiothoracic Vascular Surgery)

## 2010-10-28 DIAGNOSIS — J869 Pyothorax without fistula: Secondary | ICD-10-CM

## 2010-10-29 NOTE — Assessment & Plan Note (Signed)
OFFICE VISIT  Robbins, Terry DOB:  [birth year]                                        Oct 28, 2010 CHART #:  [id number]  The patient is a 77 year old woman who had a left VATS and decortication for empyema back in November 2011.  When she first presented, there was a question of an endobronchial mass.  We did a bronchoscopy on her in the OR, saw no endobronchial lesions, likely just represented sputum. With the pathology, there was no signs of any malignancy.  It was all just inflammatory infectious material.  She was treated with antibiotics.  She did have a prolonged air leak, but that subsequently resolved with placement of intrabronchial valves.  Those were then removed by Dr. Edwyna Shell.  Just because there had been a question of endobronchial mass, we decided to bring her back in 6 months for a CT of the chest to make sure there was no sign of any lung mass.  She states that her breathing has been fine.  She had an episode when she was at [employer] walking around where she said heaviness in her chest. This has happened to her one other time again when she was around a lot of plants in [place].  She says that she did not have these symptoms at all when she is exerting herself outside of that setting and she can walk up stairs without experiencing and she does not have it when taking routine walks.  She has not had any cough, hemoptysis, fevers, chills, or sweats.  She does still have some discomfort but not any significant or severe pain related to her incisions.  PHYSICAL EXAMINATION:  The patient is a 77 year old woman in no acute distress.  She is well developed and well nourished.  Blood pressure is 122/68, pulse 68, respirations are 16, her oxygen saturation is 97% on room air.  Her lungs are clear with equal breath sounds bilaterally.  LABORATORY DATA:  CT of the chest is reviewed that shows no parenchymal lung masses.  No endobronchial  lung masses.  No hilar or mediastinal adenopathy.  There is some pleural changes that have some degree of nodularity to them.  Radiologist recommended potentially doing a repeat scan in 6-12 months if there was any clinical concern.  IMPRESSION:  The patient is a 77 year old woman.  She was treated 6 months ago for an empyema with a decortication drainage of the empyema. She had a prolonged air leak that was treated with endobronchial valves. She since has been doing well with no evidence of any issues related to her lungs.  The CT scan shows no evidence of the lung mass.  This is really more of a precautionary measure.  She does have some scarring and surgical changes related to the pleura.  I do not think there is any reason to suspect she has a pleural-based mass.  She did not have any signs of a mass or malignancy at the time of her VATS, so I think it would really be overkill to continue to follow this with CT scans.  I discussed the option with the patient and certainly would be happy to do another scan in 6 months to a year if she desired, but she does not want to do that.  Certainly, she were to have any difficulties with her  breathing, we could repeat a scan at anytime.  She will follow up with Dr. Felicity Coyer.  I will be happy to see her back anytime and I could be of any further assistance with her care.  Salvatore Decent Dorris Fetch, M.D. Electronically Signed  SCH/MEDQ  D:  10/28/2010  T:  10/29/2010  Job:  [id number]  cc:   Vikki Ports A. Felicity Coyer, MD

## 2010-11-10 ENCOUNTER — Encounter: Payer: Self-pay | Admitting: Internal Medicine

## 2010-11-11 ENCOUNTER — Encounter: Payer: Self-pay | Admitting: Internal Medicine

## 2010-11-11 ENCOUNTER — Ambulatory Visit (INDEPENDENT_AMBULATORY_CARE_PROVIDER_SITE_OTHER): Payer: 59 | Admitting: Internal Medicine

## 2010-11-11 DIAGNOSIS — R079 Chest pain, unspecified: Secondary | ICD-10-CM

## 2010-11-11 MED ORDER — FAMOTIDINE 20 MG PO TABS: 20.0000 mg | ORAL_TABLET | Freq: Two times a day (BID) | ORAL | Status: DC

## 2010-11-11 NOTE — Progress Notes (Signed)
Subjective:    Patient ID: Terry Robbins, female    DOB: 1934/03/25, 77 y.o.   MRN: [id number]  HPI complains of chest "heaviness" - exac by stress - while talking on phone to [place] Felt "choked" in throat and short of breath Lasted 10 min before spontaneous resolution Never exertional - works out on elipitical machine without chest pain   hair loss - improved with shampoo change  reviewed prior med issues:  complicated PNA hx -hosp 04/2010 for L empyema - s/p VATS/minithoractomy for drainage and decort, also bronch r/o endobronch lesion x 2 (negative for cancer)  complicated by air leak and cont L chest pain - now improved  followed with CTS team every 3 mo - cxr improving, CT chest without mass - "released from follow up" 10/2010  osteoporosis - on evista for same, compliant with meds and no adv se - no fx hx or bone pain   OA - takes otc as needed for same- no flares, no swelling - pain does not limit participation in Y silver sneakers   Past Medical History  Diagnosis Date  . ARTHRITIS   . HAIR LOSS 08/17/2010  . MACULAR DEGENERATION   . PNEUMONIA 04/20/2010  . PSORIASIS, HX OF   . ROTATOR CUFF REPAIR, RIGHT, HX OF   . Empyema, left 04/2010  . OA (osteoarthritis)   . OSTEOPOROSIS      Review of Systems  Respiratory: Positive for chest tightness. Negative for cough, shortness of breath and wheezing.   Cardiovascular: Negative for palpitations and leg swelling.  Neurological: Negative for dizziness.       Objective:   Physical Exam BP 120/68  Pulse 72  Temp(Src) 98.1 F (36.7 C) (Oral)  Ht 5\' 5"  (1.651 m)  Wt 133 lb (60.328 kg)  BMI 22.13 kg/m2  SpO2 97% Physical Exam  Constitutional: She is oriented to person, place, and time. She appears well-developed and well-nourished. No distress.  Eyes: Wears corrective lenses. Conjunctivae and EOM are normal. Pupils are equal, round, and reactive to light. No scleral icterus.  Neck: Normal range of motion. Neck supple.  No JVD present. No thyromegaly present.  Cardiovascular: Normal rate, regular rhythm and normal heart sounds.  No murmur heard. No BLE edema. Pulmonary/Chest: Effort normal and breath sounds normal. No respiratory distress. She has no wheezes.  Psychiatric: She has a normal mood and affect. Her behavior is normal. Judgment and thought content normal.   Lab Results  Component Value Date   WBC 7.0 08/17/2010   HGB 13.4 08/17/2010   HCT 39.3 08/17/2010   PLT 270.0 08/17/2010   CHOL 204 02/01/2010   HDL 79 02/01/2010   ALT 14 95/62/1308   AST 19 05/29/2010   NA 139 05/29/2010   K 4.4 05/29/2010   CL 103 05/29/2010   CREATININE 0.81 05/29/2010   BUN 12 05/29/2010   CO2 29 05/29/2010   TSH 1.25 08/17/2010   INR 1.06 05/29/2010   HGBA1C  Value: 6.5 (NOTE)                                                                       According to the ADA Clinical Practice Recommendations for 2011, when HbA1c is used as a screening test:   >=  6.5%   Diagnostic of Diabetes Mellitus           (if abnormal result  is confirmed)  5.7-6.4%   Increased risk of developing Diabetes Mellitus  References:Diagnosis and Classification of Diabetes Mellitus,Diabetes Care,2011,34(Suppl 1):S62-S69 and Standards of Medical Care in         Diabetes - 2011,Diabetes Care,2011,34  (Suppl 1):S11-S61.* 04/20/2010          Assessment & Plan:  See problem list. Medications and labs reviewed today.  chest pain - non exertional, intermittent but recurrent. EKG today: sinus at 63bpm, no ischemic change - refer for stress test to exclude angina - also start H2B for possible GERD component - further eval and tx to depend on symptoms and results - f/u to review, ER if worse - pt understands and agrees

## 2010-11-11 NOTE — Patient Instructions (Signed)
It was good to see you today. Exam and EKG today ok we'll make referral for stress test to look at heart health. Our office will contact you regarding appointment(s) once made. Start Pepcid 20mg  2x/day x 30days for possible acid reflux symptoms  - Your prescription(s) have been submitted to your pharmacy. Please take as directed and contact our office if you believe you are having problem(s) with the medication(s).

## 2010-11-23 ENCOUNTER — Telehealth: Payer: Self-pay

## 2010-11-23 NOTE — Telephone Encounter (Signed)
Pt advised, understood and agreed to MD's recommendation

## 2010-11-23 NOTE — Telephone Encounter (Signed)
I would stop calcium supplements (get calcium from diet) but continue Vit D 1000u/day - the medical literature is still weighing risk/benefits related to heart disease but i believe Vit D is more important for her bones than any risk concerning her heart at this time - we will advise if the weight of evidence changes - thanks

## 2010-11-23 NOTE — Telephone Encounter (Signed)
Pt called stating there has been recent studies about a link between Vit D supplements and heart problems. Pt states she has been taking Vit D but would like VAL to advise on if she should continue and if so at what dose? Please advise.

## 2010-11-24 ENCOUNTER — Ambulatory Visit (HOSPITAL_COMMUNITY): Payer: Medicare Other | Attending: Internal Medicine

## 2010-11-24 ENCOUNTER — Other Ambulatory Visit (HOSPITAL_COMMUNITY): Payer: Self-pay | Admitting: Internal Medicine

## 2010-11-24 ENCOUNTER — Ambulatory Visit (HOSPITAL_BASED_OUTPATIENT_CLINIC_OR_DEPARTMENT_OTHER): Payer: Medicare Other | Admitting: Radiology

## 2010-11-24 DIAGNOSIS — R072 Precordial pain: Secondary | ICD-10-CM | POA: Insufficient documentation

## 2010-11-24 DIAGNOSIS — R0609 Other forms of dyspnea: Secondary | ICD-10-CM | POA: Insufficient documentation

## 2010-11-24 DIAGNOSIS — R0989 Other specified symptoms and signs involving the circulatory and respiratory systems: Secondary | ICD-10-CM

## 2010-11-24 DIAGNOSIS — R5383 Other fatigue: Secondary | ICD-10-CM | POA: Insufficient documentation

## 2010-11-24 DIAGNOSIS — R5381 Other malaise: Secondary | ICD-10-CM | POA: Insufficient documentation

## 2010-11-24 MED ORDER — PERFLUTREN PROTEIN A MICROSPH IV SUSP
3.0000 mL | Freq: Once | INTRAVENOUS | Status: AC
  Administered 2010-11-24: 3 mL via INTRAVENOUS

## 2010-11-26 ENCOUNTER — Telehealth: Payer: Self-pay | Admitting: *Deleted

## 2010-11-26 MED ORDER — OMEPRAZOLE 20 MG PO CPDR: 20.0000 mg | DELAYED_RELEASE_CAPSULE | Freq: Every day | ORAL | Status: DC

## 2010-11-26 NOTE — Telephone Encounter (Signed)
Called pt back to let her know what md suggested concerning her throat. Pt agreed to start omeprazole requesting med to be sent to pharmacy...11/26/10@2 :05pm

## 2010-12-21 ENCOUNTER — Telehealth: Payer: Self-pay | Admitting: *Deleted

## 2010-12-21 DIAGNOSIS — R05 Cough: Secondary | ICD-10-CM

## 2010-12-21 NOTE — Telephone Encounter (Signed)
Pt. Informed.

## 2010-12-21 NOTE — Telephone Encounter (Signed)
i will refer to pulm for eval of same - no med changes in interim - please continue the mucinex as ongoing until f/u with pulm for 2nd opinion on this issue

## 2010-12-21 NOTE — Telephone Encounter (Signed)
Pt inquiring about "mucus in her throat that is not heavy but is more like saliva, just thicker". Pt states that she has No cold, sore throat, and/or heaviness in chest. Pt states that she has tried the Omeprazole for 1 month and is also trying regular Mucinex. Pt would like to know if you feel that she needs a referral to a specialist, as discussed, or a new Rx for this problem.?

## 2010-12-25 ENCOUNTER — Telehealth: Payer: Self-pay | Admitting: *Deleted

## 2010-12-25 NOTE — Telephone Encounter (Signed)
Appointment made for Monday 12/28/10 @02 :30pm

## 2010-12-25 NOTE — Telephone Encounter (Signed)
FYI from Pt: received msge from Pulmonary/Dr Shona Simpson appointment 01/11/11 Pt c/o of "still having discomfort [not pain] originally more between clavical; now is much higher in chest w/mucus. Do you want to see her before pulmonary appt?

## 2010-12-25 NOTE — Telephone Encounter (Signed)
i would be happy to reeval the pt as needed prior to her pulm appt if she is feeling worse - thanks

## 2010-12-28 ENCOUNTER — Ambulatory Visit (INDEPENDENT_AMBULATORY_CARE_PROVIDER_SITE_OTHER): Payer: 59 | Admitting: Internal Medicine

## 2010-12-28 ENCOUNTER — Encounter: Payer: Self-pay | Admitting: Internal Medicine

## 2010-12-28 DIAGNOSIS — L409 Psoriasis, unspecified: Secondary | ICD-10-CM | POA: Insufficient documentation

## 2010-12-28 DIAGNOSIS — K219 Gastro-esophageal reflux disease without esophagitis: Secondary | ICD-10-CM | POA: Insufficient documentation

## 2010-12-28 DIAGNOSIS — R0789 Other chest pain: Secondary | ICD-10-CM | POA: Insufficient documentation

## 2010-12-28 DIAGNOSIS — J3089 Other allergic rhinitis: Secondary | ICD-10-CM

## 2010-12-28 DIAGNOSIS — J309 Allergic rhinitis, unspecified: Secondary | ICD-10-CM | POA: Insufficient documentation

## 2010-12-28 MED ORDER — FAMOTIDINE 20 MG PO TABS: 20.0000 mg | ORAL_TABLET | Freq: Two times a day (BID) | ORAL | Status: DC | PRN

## 2010-12-28 MED ORDER — GUAIFENESIN ER 600 MG PO TB12: 600.0000 mg | ORAL_TABLET | Freq: Two times a day (BID) | ORAL | Status: DC

## 2010-12-28 MED ORDER — FLUTICASONE PROPIONATE 50 MCG/ACT NA SUSP: 2.0000 | Freq: Every day | NASAL | Status: DC

## 2010-12-28 NOTE — Assessment & Plan Note (Signed)
More relief of symptoms on H2B than PPI - continue H2B

## 2010-12-28 NOTE — Progress Notes (Signed)
Subjective:    Patient ID: Terry Robbins, female    DOB: 1934/01/23, 77 y.o.   MRN: [id number]  HPI follow up "mucus" in chest and throat Previously complained of chest "heaviness" - exac by stress - Felt "choked" in throat and short of breath Lasted 10 min before spontaneous resolution Never exertional - works out on elipitical machine without chest pain  S/p stress echo 11/2010 - no ischemia or cardiac abnormality - symptoms have much improved with pepcid (more than prilosec) and with mucinex -  Prior lower chest tightness resolved but still "high choking" mucus in throat, esp waking in AM 2-3x/week No dysphagia, no regurgitation, no fever or weight loss  Also reviewed chronic medical issues:  hair loss - improved with shampoo change  complicated PNA hx -hosp 04/2010 for L empyema - s/p VATS/minithoractomy for drainage and decort, also bronch r/o endobronch lesion x 2 (negative for cancer); complicated by air leak and cont L chest pain - now improved.  Was followed with CTS team every 3 mo, but cxr improved, CT chest without mass - "released from follow up" 10/2010  osteoporosis - on evista for same, compliant with meds and no adv se - no fx hx or bone pain   OA - takes otc as needed for same- no flares, no swelling - pain does not limit participation in Y silver sneakers   Past Medical History  Diagnosis Date  . ARTHRITIS   . MACULAR DEGENERATION   . Empyema, left 04/2010    related to PNA; s/p VATS/minithoracotomy & bronch  . OA (osteoarthritis)   . OSTEOPOROSIS   . Psoriasis      Review of Systems  Constitutional: Negative for fever and unexpected weight change.  HENT: Positive for postnasal drip. Negative for sore throat, mouth sores and trouble swallowing.   Respiratory: Negative for cough, choking, chest tightness, shortness of breath and wheezing.   Cardiovascular: Negative for palpitations and leg swelling.  Gastrointestinal: Negative for nausea and vomiting.    Neurological: Negative for dizziness.       Objective:   Physical Exam  BP 110/72  Pulse 69  Temp(Src) 97.9 F (36.6 C) (Oral)  Ht 5\' 5"  (1.651 m)  SpO2 96% Physical Exam  Constitutional: She is oriented to person, place, and time. She appears well-developed and well-nourished. No distress.  Eyes: Wears corrective lenses. Conjunctivae and EOM are normal. Pupils are equal, round, and reactive to light. No scleral icterus.  ENT: L tonsillar crypt - filled but no exudate, no erythema, trace PND Neck: Normal range of motion. Neck supple. No JVD present. No thyromegaly present.  No mass, no LAD Cardiovascular: Normal rate, regular rhythm and normal heart sounds.  No murmur heard. No BLE edema. Pulmonary/Chest: Effort normal and breath sounds normal. No respiratory distress. She has no wheezes.  Psychiatric: She has a normal mood and affect. Her behavior is normal. Judgment and thought content normal.   Lab Results  Component Value Date   WBC 7.0 08/17/2010   HGB 13.4 08/17/2010   HCT 39.3 08/17/2010   PLT 270.0 08/17/2010   CHOL 204 02/01/2010   HDL 79 02/01/2010   ALT 14 40/98/1191   AST 19 05/29/2010   NA 139 05/29/2010   K 4.4 05/29/2010   CL 103 05/29/2010   CREATININE 0.81 05/29/2010   BUN 12 05/29/2010   CO2 29 05/29/2010   TSH 1.25 08/17/2010   INR 1.06 05/29/2010   HGBA1C  Value: 6.5 (NOTE)  According to the ADA Clinical Practice Recommendations for 2011, when HbA1c is used as a screening test:   >=6.5%   Diagnostic of Diabetes Mellitus           (if abnormal result  is confirmed)  5.7-6.4%   Increased risk of developing Diabetes Mellitus  References:Diagnosis and Classification of Diabetes Mellitus,Diabetes Care,2011,34(Suppl 1):S62-S69 and Standards of Medical Care in         Diabetes - 2011,Diabetes Care,2011,34  (Suppl 1):S11-S61.* 04/20/2010          Assessment & Plan:  See problem list.  Medications and labs reviewed today.

## 2010-12-28 NOTE — Assessment & Plan Note (Signed)
  Prior symptoms resolved - stress echo 11/2010 unremarkable Continue tx for GERD and allergies, ok to hold in pulm eval at this time

## 2010-12-28 NOTE — Assessment & Plan Note (Signed)
PND likely contributing to mucus in throat sensation - add flonase - erx done

## 2010-12-28 NOTE — Patient Instructions (Addendum)
It was good to see you today. We have reviewed your prior records including labs and tests today Start Flonase nasal spray as discussed - Your prescription(s) have been submitted to your pharmacy. Please take as directed and contact our office if you believe you are having problem(s) with the medication(s). Continue Pepcid for acid reflux symptoms and mucinex for mucus as discussed -  Please schedule followup in 3-4 months, call sooner if problems. Will plan your physical and labs next visit

## 2011-01-11 ENCOUNTER — Institutional Professional Consult (permissible substitution): Payer: Medicare Other | Admitting: Internal Medicine

## 2011-02-24 ENCOUNTER — Ambulatory Visit: Payer: 59

## 2011-03-18 ENCOUNTER — Other Ambulatory Visit: Payer: Self-pay | Admitting: Internal Medicine

## 2011-03-18 DIAGNOSIS — Z1231 Encounter for screening mammogram for malignant neoplasm of breast: Secondary | ICD-10-CM

## 2011-03-25 ENCOUNTER — Ambulatory Visit (INDEPENDENT_AMBULATORY_CARE_PROVIDER_SITE_OTHER): Payer: 59 | Admitting: *Deleted

## 2011-03-25 DIAGNOSIS — Z23 Encounter for immunization: Secondary | ICD-10-CM

## 2011-04-02 ENCOUNTER — Ambulatory Visit (HOSPITAL_COMMUNITY): Payer: 59

## 2011-04-08 ENCOUNTER — Ambulatory Visit (HOSPITAL_COMMUNITY)
Admission: RE | Admit: 2011-04-08 | Discharge: 2011-04-08 | Disposition: A | Discharge status: Home / Self Care | Payer: PRIVATE HEALTH INSURANCE | Source: Ambulatory Visit | Attending: Internal Medicine | Admitting: Internal Medicine

## 2011-04-08 DIAGNOSIS — Z1231 Encounter for screening mammogram for malignant neoplasm of breast: Secondary | ICD-10-CM | POA: Insufficient documentation

## 2011-04-26 ENCOUNTER — Encounter: Payer: Self-pay | Admitting: Internal Medicine

## 2011-04-26 ENCOUNTER — Ambulatory Visit (INDEPENDENT_AMBULATORY_CARE_PROVIDER_SITE_OTHER): Payer: 59 | Admitting: Internal Medicine

## 2011-04-26 VITALS — BP 120/58 | HR 82 | Temp 97.9°F | Ht 65.0 in | Wt 141.1 lb

## 2011-04-26 DIAGNOSIS — J3489 Other specified disorders of nose and nasal sinuses: Secondary | ICD-10-CM

## 2011-04-26 DIAGNOSIS — J340 Abscess, furuncle and carbuncle of nose: Secondary | ICD-10-CM

## 2011-04-26 MED ORDER — MUPIROCIN 2 % EX OINT: TOPICAL_OINTMENT | CUTANEOUS | Status: AC

## 2011-04-26 NOTE — Patient Instructions (Signed)
It was good to see you today. Use Bactroban ointment to right nostril 3 times as needed for the next 3 weeks - Your prescription(s) have been submitted to your pharmacy. Please take as directed and contact our office if you believe you are having problem(s) with the medication(s). Other Medications reviewed, no changes at this time.

## 2011-04-26 NOTE — Progress Notes (Signed)
  Subjective:    Patient ID: Terry Robbins, female    DOB: 03/03/1934, 77 y.o.   MRN: [id number]  HPI Complains of right nostril sore recurrent symptoms for past 6 months No bleeding or swelling of right nares No drainage or fever Denies trauma or frequent picking No history of same No sinus pain, discharge or pressure  Past Medical History  Diagnosis Date  . ARTHRITIS   . MACULAR DEGENERATION   . Empyema, left 04/2010    related to PNA; s/p VATS/minithoracotomy & bronch  . OA (osteoarthritis)   . OSTEOPOROSIS   . Psoriasis     Review of Systems  Respiratory: Negative for cough and shortness of breath.   Cardiovascular: Negative for chest pain and palpitations.  Neurological: Negative for facial asymmetry, speech difficulty and weakness.       Objective:   Physical Exam BP 120/58  Pulse 82  Temp(Src) 97.9 F (36.6 C) (Oral)  Ht 5\' 5"  (1.651 m)  Wt 141 lb 1.3 oz (63.993 kg)  BMI 23.48 kg/m2  SpO2 93% Wt Readings from Last 3 Encounters:  04/26/11 141 lb 1.3 oz (63.993 kg)  11/11/10 133 lb (60.328 kg)  08/17/10 127 lb 3.2 oz (57.698 kg)   Gen: NAD HENT: Nontender sinuses to palpation. R nares with small septal ulceration, superficial without bleeding or drainage; OP clear without erythema or exudate Lung: CTA B CV: RRR  Lab Results  Component Value Date   WBC 7.0 08/17/2010   HGB 13.4 08/17/2010   HCT 39.3 08/17/2010   PLT 270.0 08/17/2010   GLUCOSE 113* 05/29/2010   CHOL 204 02/01/2010   HDL 79 02/01/2010   LDLCALC 99 02/01/2010   ALT 14 05/29/2010   AST 19 05/29/2010   NA 139 05/29/2010   K 4.4 05/29/2010   CL 103 05/29/2010   CREATININE 0.81 05/29/2010   BUN 12 05/29/2010   CO2 29 05/29/2010   TSH 1.25 08/17/2010   INR 1.06 05/29/2010   HGBA1C  Value: 6.5 (NOTE)                                                                       According to the ADA Clinical Practice Recommendations for 2011, when HbA1c is used as a screening test:   >=6.5%   Diagnostic  of Diabetes Mellitus           (if abnormal result  is confirmed)  5.7-6.4%   Increased risk of developing Diabetes Mellitus  References:Diagnosis and Classification of Diabetes Mellitus,Diabetes Care,2011,34(Suppl 1):S62-S69 and Standards of Medical Care in         Diabetes - 2011,Diabetes Care,2011,34  (Suppl 1):S11-S61.* 04/20/2010        Assessment & Plan:  Right nasal ulcer - prescribe mupirocin ointment, humidified air - if unimproved, refer to ENT

## 2011-05-14 ENCOUNTER — Telehealth: Payer: Self-pay | Admitting: *Deleted

## 2011-05-14 NOTE — Telephone Encounter (Signed)
Pt is having dental implant surgery and wants your opinion on if she should go ahead and have this done. She is scheduled for Jan 9th with Dr Gwendlyn Deutscher

## 2011-05-14 NOTE — Telephone Encounter (Signed)
Informed pt .

## 2011-05-14 NOTE — Telephone Encounter (Signed)
Yes. Ok with me to proceed

## 2011-06-07 ENCOUNTER — Telehealth: Payer: Self-pay

## 2011-06-07 NOTE — Telephone Encounter (Signed)
If no further pain or fever, no need to take further antibx  She should o/w finish her first course as this seemed to help

## 2011-06-07 NOTE — Telephone Encounter (Signed)
Informed the patient of MD's instruction.

## 2011-06-07 NOTE — Telephone Encounter (Signed)
Pt called stating she has appt to have dental implants on Jan 9th and her nasal infection has returned (OV 11/19). Pt states that MD will not do implants if she has an active infection. Pt is requesting advisement from MD, should she repeat ABX (she did not complete first round because sore began improving), or should she see an ENT ASAP, please advise.

## 2011-06-09 ENCOUNTER — Telehealth: Payer: Self-pay

## 2011-06-09 DIAGNOSIS — J340 Abscess, furuncle and carbuncle of nose: Secondary | ICD-10-CM

## 2011-06-09 NOTE — Telephone Encounter (Signed)
Pt advised, agrees and will expect a call from [facility] with appt info.

## 2011-06-09 NOTE — Telephone Encounter (Signed)
Pt called because she is concerned that infection in her nose may complicate dental implant surgery that she is scheduled for 01/09. Pt says that sore has return by only mildly. Should pt continue with surgery or postpone and be referred to ENT, please advise.

## 2011-06-09 NOTE — Telephone Encounter (Signed)
I would advise resched dental repair and eval by ENT - referral ENT done

## 2011-07-12 ENCOUNTER — Other Ambulatory Visit: Payer: Self-pay | Admitting: Internal Medicine

## 2011-12-22 ENCOUNTER — Telehealth: Payer: Self-pay | Admitting: *Deleted

## 2011-12-22 DIAGNOSIS — Z0000 Encounter for general adult medical examination without abnormal findings: Secondary | ICD-10-CM

## 2011-12-22 NOTE — Telephone Encounter (Signed)
Message copied by Deatra James on Wed Dec 22, 2011 10:32 AM ------      Message from: Burnett Harry      Created: Wed Dec 22, 2011 10:18 AM      Regarding: CPE 03/01/2012       THANKS!

## 2011-12-22 NOTE — Telephone Encounter (Signed)
Received staff msg pt made cpx for 03/01/12 need labs entered in epic,,,, 12/22/11@10 :33am/LMB

## 2012-03-01 ENCOUNTER — Encounter: Payer: 59 | Admitting: Internal Medicine

## 2012-03-10 ENCOUNTER — Encounter: Payer: Self-pay | Admitting: Internal Medicine

## 2012-03-10 ENCOUNTER — Ambulatory Visit (INDEPENDENT_AMBULATORY_CARE_PROVIDER_SITE_OTHER): Payer: 59 | Admitting: Internal Medicine

## 2012-03-10 VITALS — BP 122/70 | HR 77 | Temp 97.4°F | Ht 65.0 in | Wt 138.0 lb

## 2012-03-10 DIAGNOSIS — Z833 Family history of diabetes mellitus: Secondary | ICD-10-CM

## 2012-03-10 DIAGNOSIS — M810 Age-related osteoporosis without current pathological fracture: Secondary | ICD-10-CM

## 2012-03-10 DIAGNOSIS — Z23 Encounter for immunization: Secondary | ICD-10-CM

## 2012-03-10 DIAGNOSIS — Z0000 Encounter for general adult medical examination without abnormal findings: Secondary | ICD-10-CM

## 2012-03-10 NOTE — Patient Instructions (Signed)
It was good to see you today. We have reviewed your prior records including labs and tests today Health Maintenance reviewed - all recommended immunizations and age-appropriate screenings are up-to-date. we'll make referral for bone density screening as discussed. Our office will contact you regarding appointment(s) once made. Test(s) ordered today. Your results will be released to MyChart (or called to you) after review, usually within 72hours after test completion. If any changes need to be made, you will be notified at that same time. Please schedule followup in 12 months, call sooner if problems. We will complete your insurer forms after labs reviewed and let you know when this is done Health Maintenance, Females A healthy lifestyle and preventative care can promote health and wellness.  Maintain regular health, dental, and eye exams.   Eat a healthy diet. Foods like vegetables, fruits, whole grains, low-fat dairy products, and lean protein foods contain the nutrients you need without too many calories. Decrease your intake of foods high in solid fats, added sugars, and salt. Get information about a proper diet from your caregiver, if necessary.   Regular physical exercise is one of the most important things you can do for your health. Most adults should get at least 150 minutes of moderate-intensity exercise (any activity that increases your heart rate and causes you to sweat) each week. In addition, most adults need muscle-strengthening exercises on 2 or more days a week.     Maintain a healthy weight. The body mass index (BMI) is a screening tool to identify possible weight problems. It provides an estimate of body fat based on height and weight. Your caregiver can help determine your BMI, and can help you achieve or maintain a healthy weight. For adults 20 years and older:   A BMI below 18.5 is considered underweight.   A BMI of 18.5 to 24.9 is normal.   A BMI of 25 to 29.9 is  considered overweight.   A BMI of 30 and above is considered obese.   Maintain normal blood lipids and cholesterol by exercising and minimizing your intake of saturated fat. Eat a balanced diet with plenty of fruits and vegetables. Blood tests for lipids and cholesterol should begin at age 65 and be repeated every 5 years. If your lipid or cholesterol levels are high, you are over 50, or you are a high risk for heart disease, you may need your cholesterol levels checked more frequently. Ongoing high lipid and cholesterol levels should be treated with medicines if diet and exercise are not effective.   If you smoke, find out from your caregiver how to quit. If you do not use tobacco, do not start.   If you are pregnant, do not drink alcohol. If you are breastfeeding, be very cautious about drinking alcohol. If you are not pregnant and choose to drink alcohol, do not exceed 1 drink per day. One drink is considered to be 12 ounces (355 mL) of beer, 5 ounces (148 mL) of wine, or 1.5 ounces (44 mL) of liquor.   Avoid use of street drugs. Do not share needles with anyone. Ask for help if you need support or instructions about stopping the use of drugs.   High blood pressure causes heart disease and increases the risk of stroke. Blood pressure should be checked at least every 1 to 2 years. Ongoing high blood pressure should be treated with medicines, if weight loss and exercise are not effective.   If you are 76 to 76 years old, ask  your caregiver if you should take aspirin to prevent strokes.   Diabetes screening involves taking a blood sample to check your fasting blood sugar level. This should be done once every 3 years, after age 35, if you are within normal weight and without risk factors for diabetes. Testing should be considered at a younger age or be carried out more frequently if you are overweight and have at least 1 risk factor for diabetes.   Breast cancer screening is essential preventative  care for women. You should practice "breast self-awareness." This means understanding the normal appearance and feel of your breasts and may include breast self-examination. Any changes detected, no matter how small, should be reported to a caregiver. Women in their 59s and 30s should have a clinical breast exam (CBE) by a caregiver as part of a regular health exam every 1 to 3 years. After age 24, women should have a CBE every year. Starting at age 75, women should consider having a mammogram (breast X-ray) every year. Women who have a family history of breast cancer should talk to their caregiver about genetic screening. Women at a high risk of breast cancer should talk to their caregiver about having an MRI and a mammogram every year.   The Pap test is a screening test for cervical cancer. Women should have a Pap test starting at age 94. Between ages 65 and 20, Pap tests should be repeated every 2 years. Beginning at age 10, you should have a Pap test every 3 years as long as the past 3 Pap tests have been normal. If you had a hysterectomy for a problem that was not cancer or a condition that could lead to cancer, then you no longer need Pap tests. If you are between ages 32 and 81, and you have had normal Pap tests going back 10 years, you no longer need Pap tests. If you have had past treatment for cervical cancer or a condition that could lead to cancer, you need Pap tests and screening for cancer for at least 20 years after your treatment. If Pap tests have been discontinued, risk factors (such as a new sexual partner) need to be reassessed to determine if screening should be resumed. Some women have medical problems that increase the chance of getting cervical cancer. In these cases, your caregiver may recommend more frequent screening and Pap tests.   The human papillomavirus (HPV) test is an additional test that may be used for cervical cancer screening. The HPV test looks for the virus that can cause  the cell changes on the cervix. The cells collected during the Pap test can be tested for HPV. The HPV test could be used to screen women aged 28 years and older, and should be used in women of any age who have unclear Pap test results. After the age of 64, women should have HPV testing at the same frequency as a Pap test.   Colorectal cancer can be detected and often prevented. Most routine colorectal cancer screening begins at the age of 5 and continues through age 33. However, your caregiver may recommend screening at an earlier age if you have risk factors for colon cancer. On a yearly basis, your caregiver may provide home test kits to check for hidden blood in the stool. Use of a small camera at the end of a tube, to directly examine the colon (sigmoidoscopy or colonoscopy), can detect the earliest forms of colorectal cancer. Talk to your caregiver about this at  age 30, when routine screening begins. Direct examination of the colon should be repeated every 5 to 10 years through age 59, unless early forms of pre-cancerous polyps or small growths are found.   Hepatitis C blood testing is recommended for all people born from [birth year] through 1963 and any individual with known risks for hepatitis C.   Practice safe sex. Use condoms and avoid high-risk sexual practices to reduce the spread of sexually transmitted infections (STIs). Sexually active women aged 60 and younger should be checked for Chlamydia, which is a common sexually transmitted infection. Older women with new or multiple partners should also be tested for Chlamydia. Testing for other STIs is recommended if you are sexually active and at increased risk.   Osteoporosis is a disease in which the bones lose minerals and strength with aging. This can result in serious bone fractures. The risk of osteoporosis can be identified using a bone density scan. Women ages 101 and over and women at risk for fractures or osteoporosis should discuss screening  with their caregivers. Ask your caregiver whether you should be taking a calcium supplement or vitamin D to reduce the rate of osteoporosis.   Menopause can be associated with physical symptoms and risks. Hormone replacement therapy is available to decrease symptoms and risks. You should talk to your caregiver about whether hormone replacement therapy is right for you.   Use sunscreen with a sun protection factor (SPF) of 30 or greater. Apply sunscreen liberally and repeatedly throughout the day. You should seek shade when your shadow is shorter than you. Protect yourself by wearing long sleeves, pants, a wide-brimmed hat, and sunglasses year round, whenever you are outdoors.   Notify your caregiver of new moles or changes in moles, especially if there is a change in shape or color. Also notify your caregiver if a mole is larger than the size of a pencil eraser.   Stay current with your immunizations.  Document Released: 12/07/2010 Document Revised: 08/16/2011 Document Reviewed: 12/07/2010 [facility] Patient Information 2013 [place].

## 2012-03-10 NOTE — Progress Notes (Signed)
Subjective:    Patient ID: Terry Robbins, female    DOB: Feb 20, 1934, 78 y.o.   MRN: [id number]  HPI  patient is here today for annual physical. Patient feels well and has no complaints.  Diet: heart healthy Physical activity: active Depression/mood screen: negative Hearing: intact to whispered voice Visual acuity: grossly normal, performs annual eye exam  ADLs: capable Fall risk: none Home safety: good Cognitive evaluation: intact to orientation, naming, recall and repetition EOL planning: adv directives, full code/ I agree  I have personally reviewed and have noted 1. The patient's medical and social history 2. Their use of alcohol, tobacco or illicit drugs 3. Their current medications and supplements 4. The patient's functional ability including ADL's, fall risks, home safety risks and hearing or visual impairment. 5. Diet and physical activities 6. Evidence for depression or mood disorders  Past Medical History  Diagnosis Date  . ARTHRITIS   . MACULAR DEGENERATION   . Empyema, left 04/2010    related to PNA; s/p VATS/minithoracotomy & bronch  . OA (osteoarthritis)   . OSTEOPOROSIS   . Psoriasis    Family History  Problem Relation Age of Onset  . Diabetes Other     Parent  . Hypertension Other     parent  . Dementia Other     Parent  . Lung cancer Other     Parent   History  Substance Use Topics  . Smoking status: Former Smoker    Types: Cigarettes    Quit date: 06/07/1977  . Smokeless tobacco: Not on file   Comment: Divorced, lives alone- Very active at [employer], church. retired from [employer] 2006  . Alcohol Use: No    Review of Systems Constitutional: Negative for fever or weight change.  Respiratory: Negative for cough and shortness of breath.   Cardiovascular: Negative for chest pain or palpitations.  Gastrointestinal: Negative for abdominal pain, no bowel changes.  Musculoskeletal: Negative for gait problem or joint swelling.  Skin: Negative for  rash.  Neurological: Negative for dizziness or headache.  No other specific complaints in a complete review of systems (except as listed in HPI above).     Objective:   Physical Exam BP 122/70  Pulse 77  Temp 97.4 F (36.3 C) (Oral)  Ht 5\' 5"  (1.651 m)  Wt 138 lb (62.596 kg)  BMI 22.96 kg/m2  SpO2 97% Wt Readings from Last 3 Encounters:  03/10/12 138 lb (62.596 kg)  04/26/11 141 lb 1.3 oz (63.993 kg)  11/11/10 133 lb (60.328 kg)   Constitutional: She appears well-developed and well-nourished. No distress.  HENT: Head: Normocephalic and atraumatic. Ears: B TMs ok, no erythema or effusion; Nose: Nose normal. Mouth/Throat: Oropharynx is clear and moist. No oropharyngeal exudate.  Eyes: Wears corrective lenses. Conjunctivae and EOM are normal. Pupils are equal, round, and reactive to light. No scleral icterus.  Neck: Normal range of motion. Neck supple. No JVD present. No thyromegaly present.  Cardiovascular: Normal rate, regular rhythm and normal heart sounds.  No murmur heard. No BLE edema. Pulmonary/Chest: Effort normal and breath sounds normal. No respiratory distress. She has no wheezes.  Abdominal: Soft. Bowel sounds are normal. She exhibits no distension. There is no tenderness. no masses Musculoskeletal: Normal range of motion, no joint effusions. No gross deformities Neurological: She is alert and oriented to person, place, and time. No cranial nerve deficit. Coordination normal.  Skin: Small corn on the dorsal pad fifth metatarsal right foot -remaining skin is warm and dry. No  rash noted. No erythema.  Psychiatric: She has a normal mood and affect. Her behavior is normal. Judgment and thought content normal.   Lab Results  Component Value Date   WBC 7.0 08/17/2010   HGB 13.4 08/17/2010   HCT 39.3 08/17/2010   PLT 270.0 08/17/2010   GLUCOSE 113* 05/29/2010   CHOL 204 02/01/2010   HDL 79 02/01/2010   LDLCALC 99 02/01/2010   ALT 14 05/29/2010   AST 19 05/29/2010   NA 139  05/29/2010   K 4.4 05/29/2010   CL 103 05/29/2010   CREATININE 0.81 05/29/2010   BUN 12 05/29/2010   CO2 29 05/29/2010   TSH 1.25 08/17/2010   INR 1.06 05/29/2010   HGBA1C  Value: 6.5  04/20/2010        Assessment & Plan:   CPX/AWV/v70.0 - Today patient counseled on age appropriate routine health concerns for screening and prevention, each reviewed and up to date or declined. Immunizations reviewed and up to date or declined. Labs/ECG reviewed. Risk factors for depression reviewed and negative. Hearing function and visual acuity are intact. ADLs screened and addressed as needed. Functional ability and level of safety reviewed and appropriate. Education, counseling and referrals performed based on assessed risks today. Patient provided with a copy of personalized plan for preventive services.

## 2012-03-10 NOTE — Assessment & Plan Note (Signed)
On Evista therapy and takes vitamin D plus calcium for same Refer for DEXA as greater than 3 years since last done No history fracture, no bone pain. Active with weightbearing activity

## 2012-03-13 ENCOUNTER — Other Ambulatory Visit (INDEPENDENT_AMBULATORY_CARE_PROVIDER_SITE_OTHER): Payer: 59

## 2012-03-13 DIAGNOSIS — Z833 Family history of diabetes mellitus: Secondary | ICD-10-CM

## 2012-03-13 DIAGNOSIS — Z0000 Encounter for general adult medical examination without abnormal findings: Secondary | ICD-10-CM

## 2012-03-13 LAB — HEPATIC FUNCTION PANEL
AST: 16 U/L (ref 0–37)
Total Bilirubin: 0.6 mg/dL (ref 0.3–1.2)

## 2012-03-13 LAB — URINALYSIS, ROUTINE W REFLEX MICROSCOPIC
Nitrite: NEGATIVE
Specific Gravity, Urine: 1.025 (ref 1.000–1.030)
Urine Glucose: NEGATIVE
Urobilinogen, UA: 0.2 (ref 0.0–1.0)

## 2012-03-13 LAB — BASIC METABOLIC PANEL
CO2: 31 mEq/L (ref 19–32)
Calcium: 9 mg/dL (ref 8.4–10.5)
Glucose, Bld: 90 mg/dL (ref 70–99)
Potassium: 4.2 mEq/L (ref 3.5–5.1)
Sodium: 142 mEq/L (ref 135–145)

## 2012-03-13 LAB — CBC WITH DIFFERENTIAL/PLATELET
Basophils Absolute: 0 10*3/uL (ref 0.0–0.1)
Basophils Relative: 0.3 % (ref 0.0–3.0)
Eosinophils Absolute: 0.4 10*3/uL (ref 0.0–0.7)
Lymphocytes Relative: 26 % (ref 12.0–46.0)
MCHC: 33.7 g/dL (ref 30.0–36.0)
Neutrophils Relative %: 59.7 % (ref 43.0–77.0)
RBC: 4.8 Mil/uL (ref 3.87–5.11)

## 2012-03-13 LAB — LIPID PANEL
HDL: 86.3 mg/dL (ref >39.00)
Total CHOL/HDL Ratio: 2

## 2012-03-24 IMAGING — CR DG CHEST 1V PORT
1 series · 1 of 1 positions shown · non-contrast
Comparison: the previous day's study

CLINICAL DATA: Left lung mass

PORTABLE CHEST - 1 VIEW

[view not recorded]
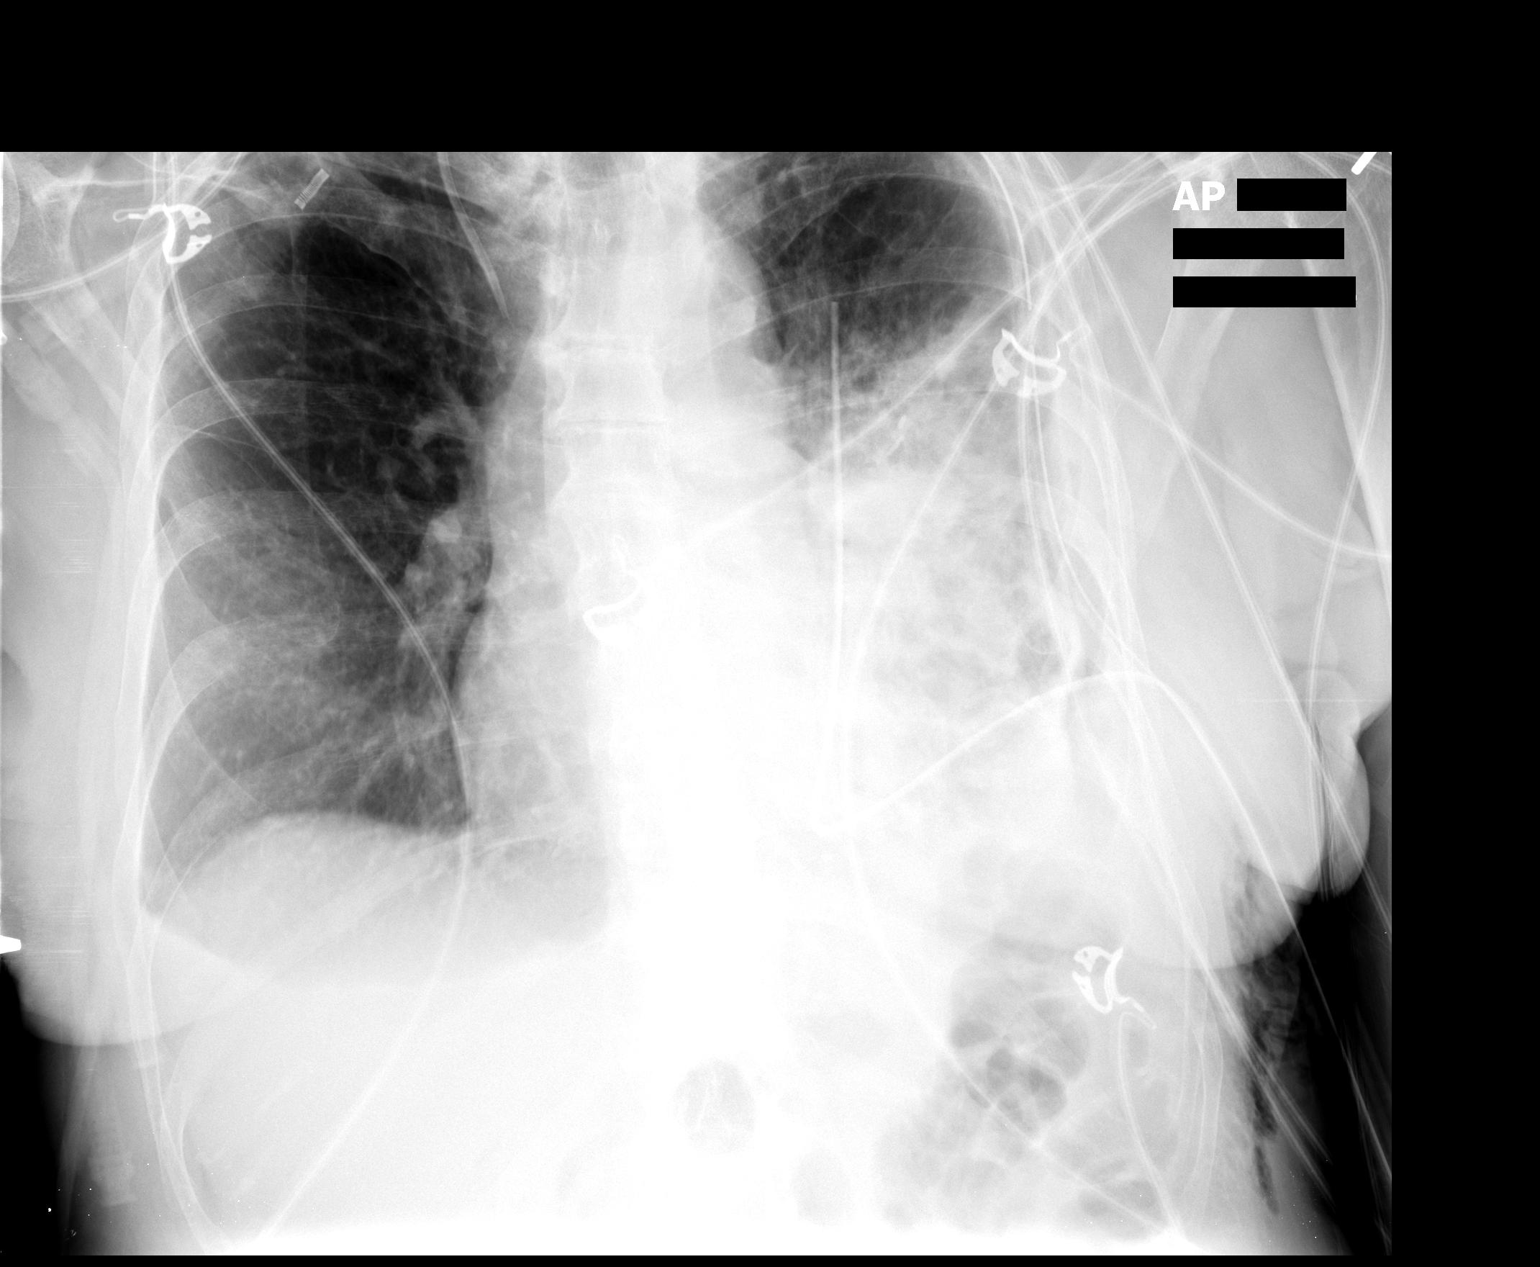

[1 of 1 positions shown; findings below may reference images not displayed]

FINDINGS: Two left chest tubes remain in place.  A small lateral
pneumothorax at the lung base is slightly more conspicuous.  Right
IJ central line stable.  Patchy consolidation / atelectasis in the
left mid and lower lung has slightly increased.  Heart size stable.
Right lung clear.
IMPRESSION: 1.  Stable left chest tubes with slight increase in pneumothorax
laterally at the lung base.

## 2012-03-31 ENCOUNTER — Encounter: Payer: Self-pay | Admitting: Internal Medicine

## 2012-04-03 ENCOUNTER — Encounter: Payer: Self-pay | Admitting: Internal Medicine

## 2012-04-04 ENCOUNTER — Telehealth: Payer: Self-pay | Admitting: *Deleted

## 2012-04-04 NOTE — Telephone Encounter (Signed)
MD received results back from bone density. Wanted to inform pt DEXA shows mild osteopenia; no significant from prior test 12/2007. Continue calcium plus D & walking for exercise. Called pt gave md response...Terry Robbins

## 2012-04-06 IMAGING — CR DG CHEST 2V
2 series · 2 of 2 positions shown · non-contrast
Comparison: 05/08/2010

CLINICAL DATA: Left-sided chest tube and left effusion and
atelectasis.

CHEST - 2 VIEW

[w chest pa]
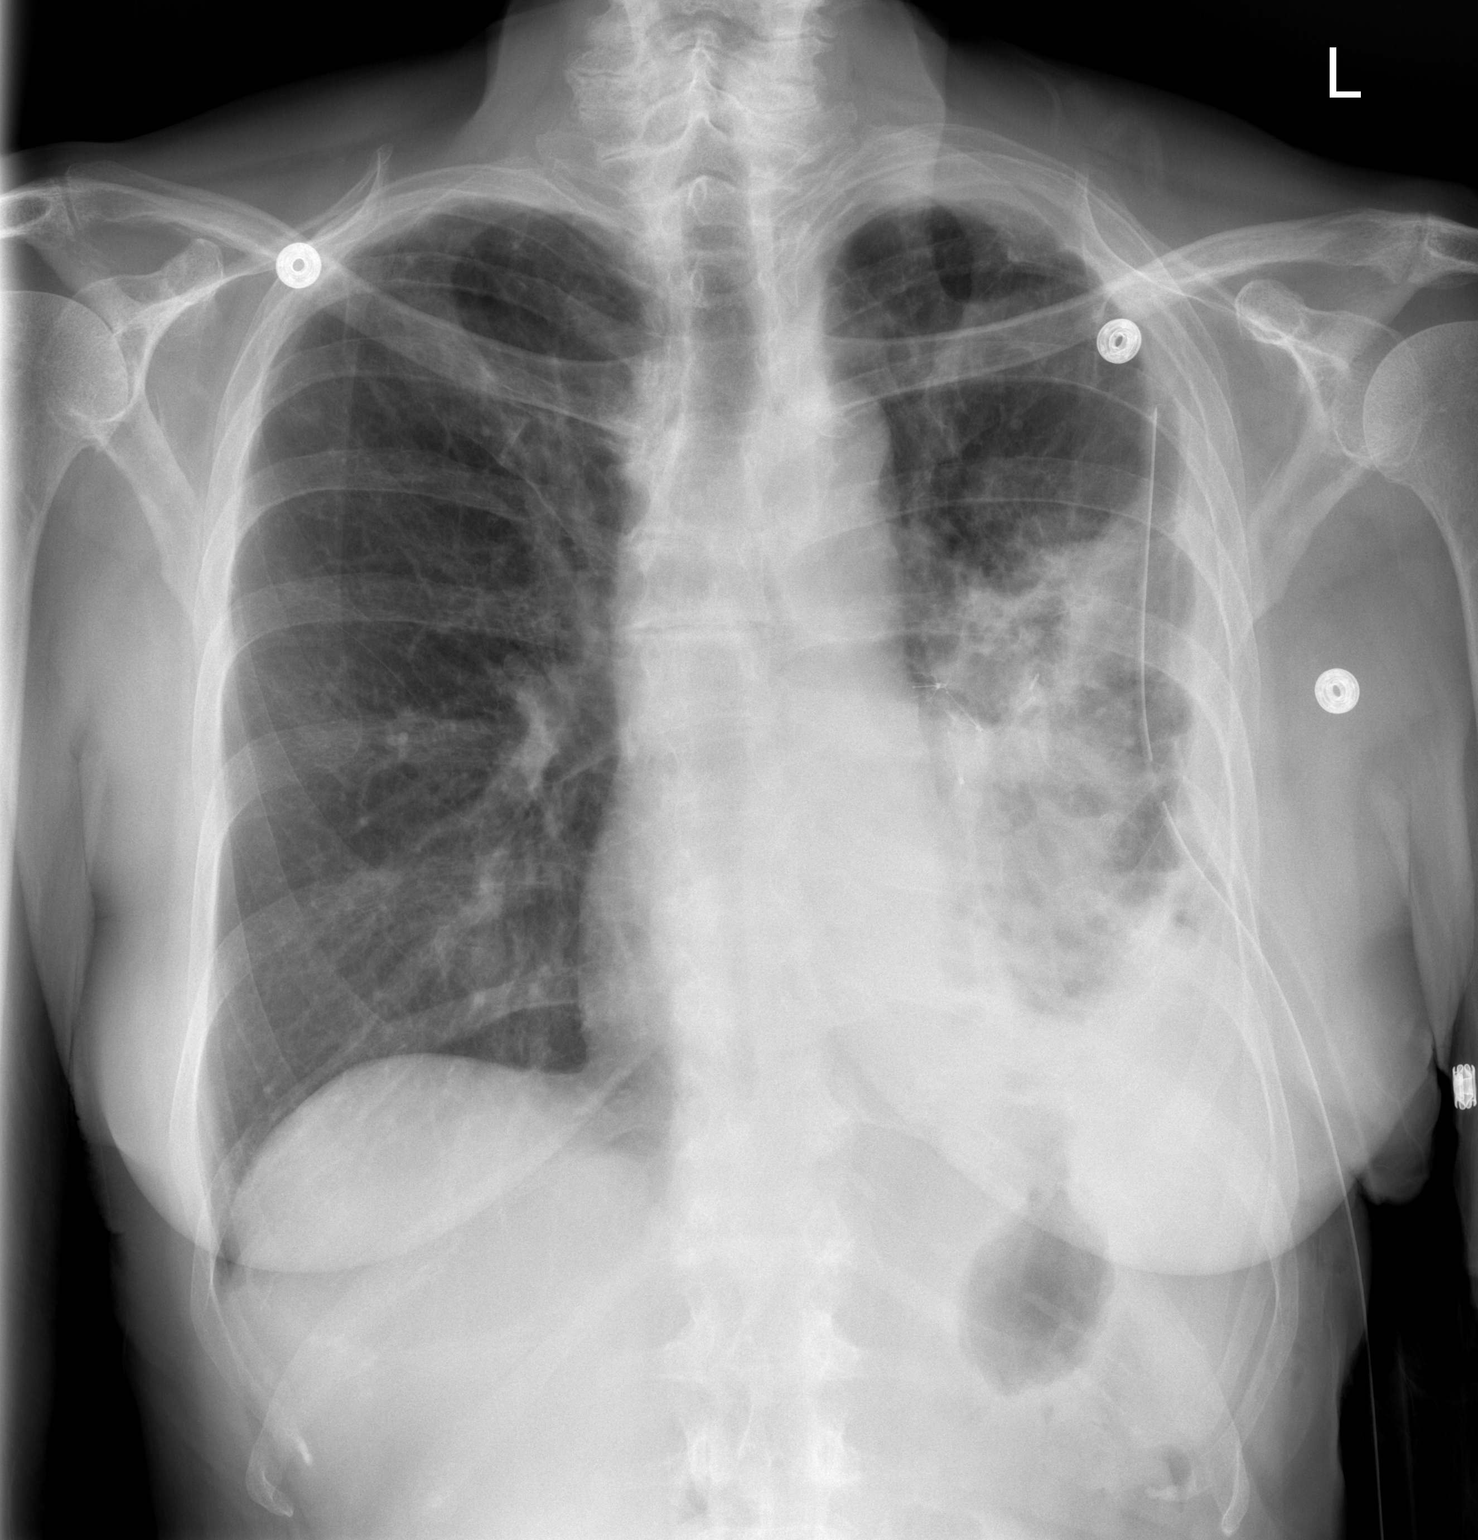

[w chest lat]
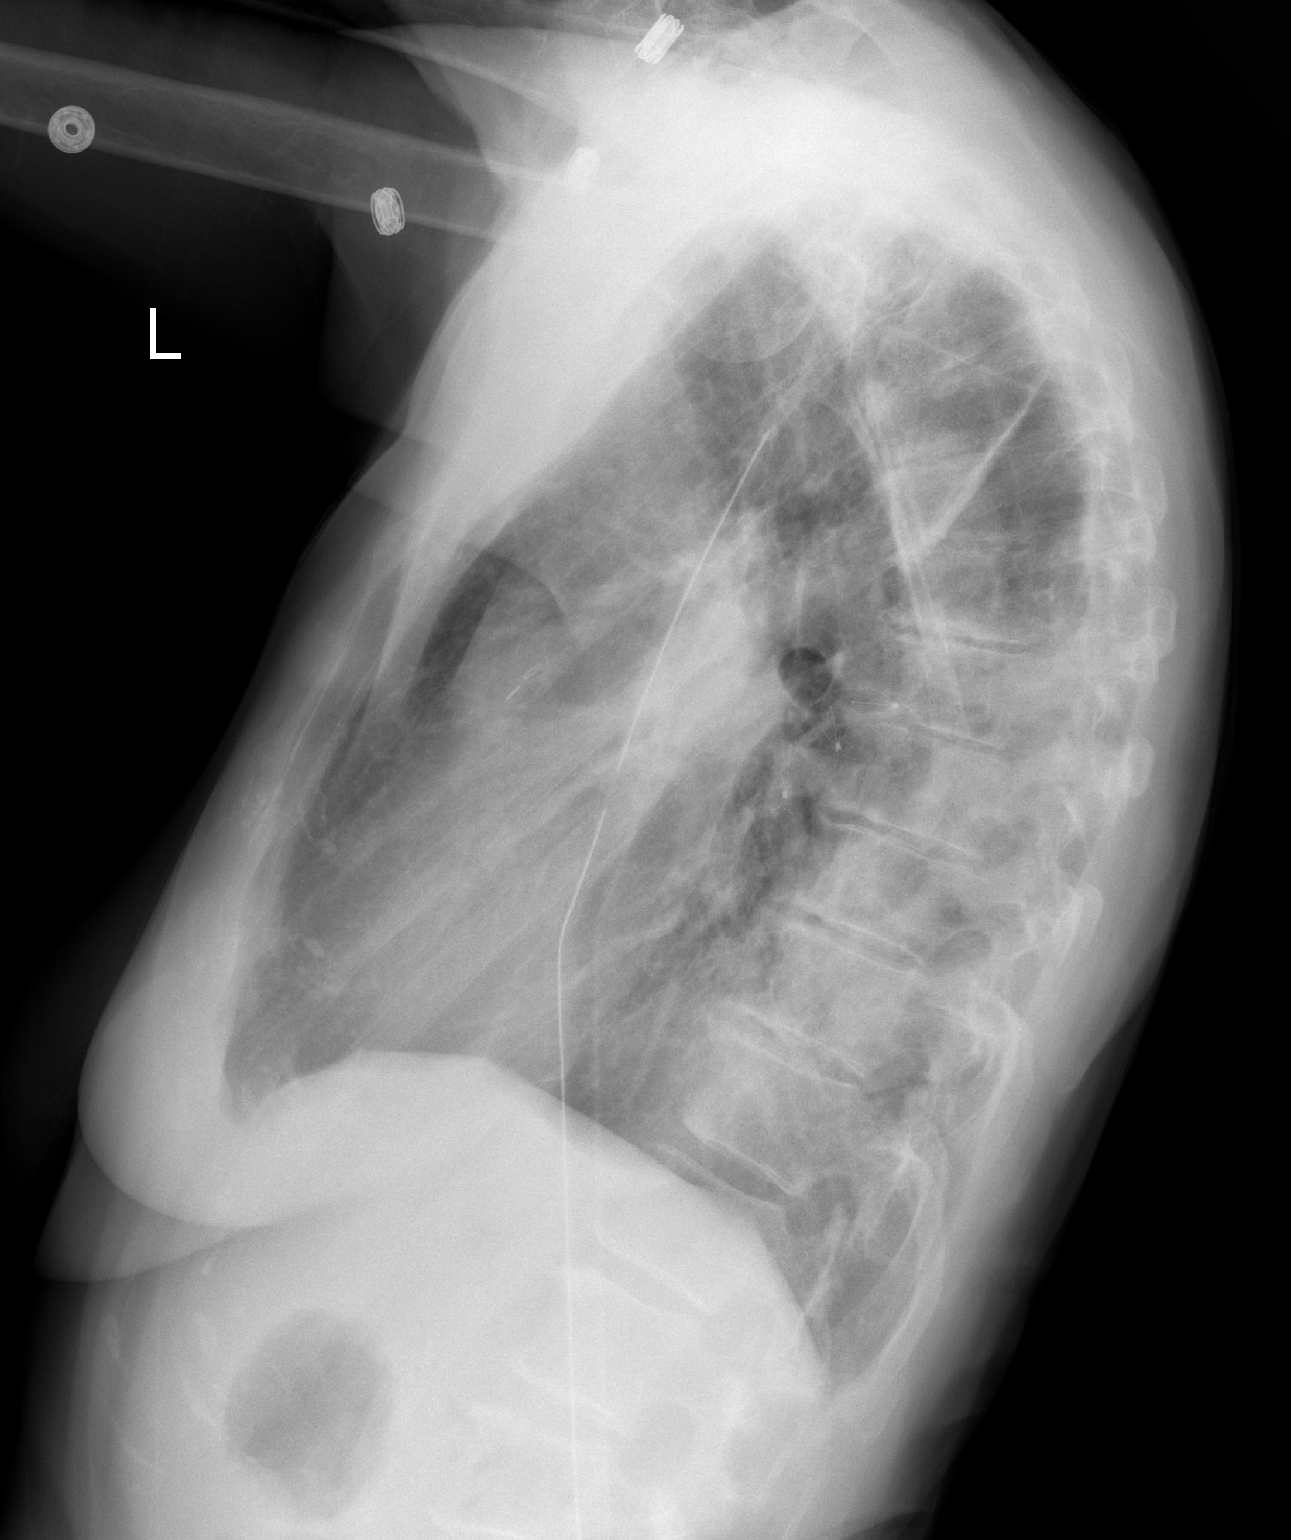

[2 of 2 positions shown; findings below may reference images not displayed]

FINDINGS: There is no pneumothorax.  Patchy areas of infiltrate and
atelectasis and effusion in the left hemithorax are unchanged.  The
right lung remains clear.  Heart size and vascularity are normal.
IMPRESSION: No change.  No pneumothorax.

## 2012-04-07 IMAGING — CR DG CHEST 2V
2 series · 2 of 2 positions shown · non-contrast
Comparison: 05/09/2010

CLINICAL DATA: Left atelectasis and effusion.

CHEST - 2 VIEW

[w chest pa]
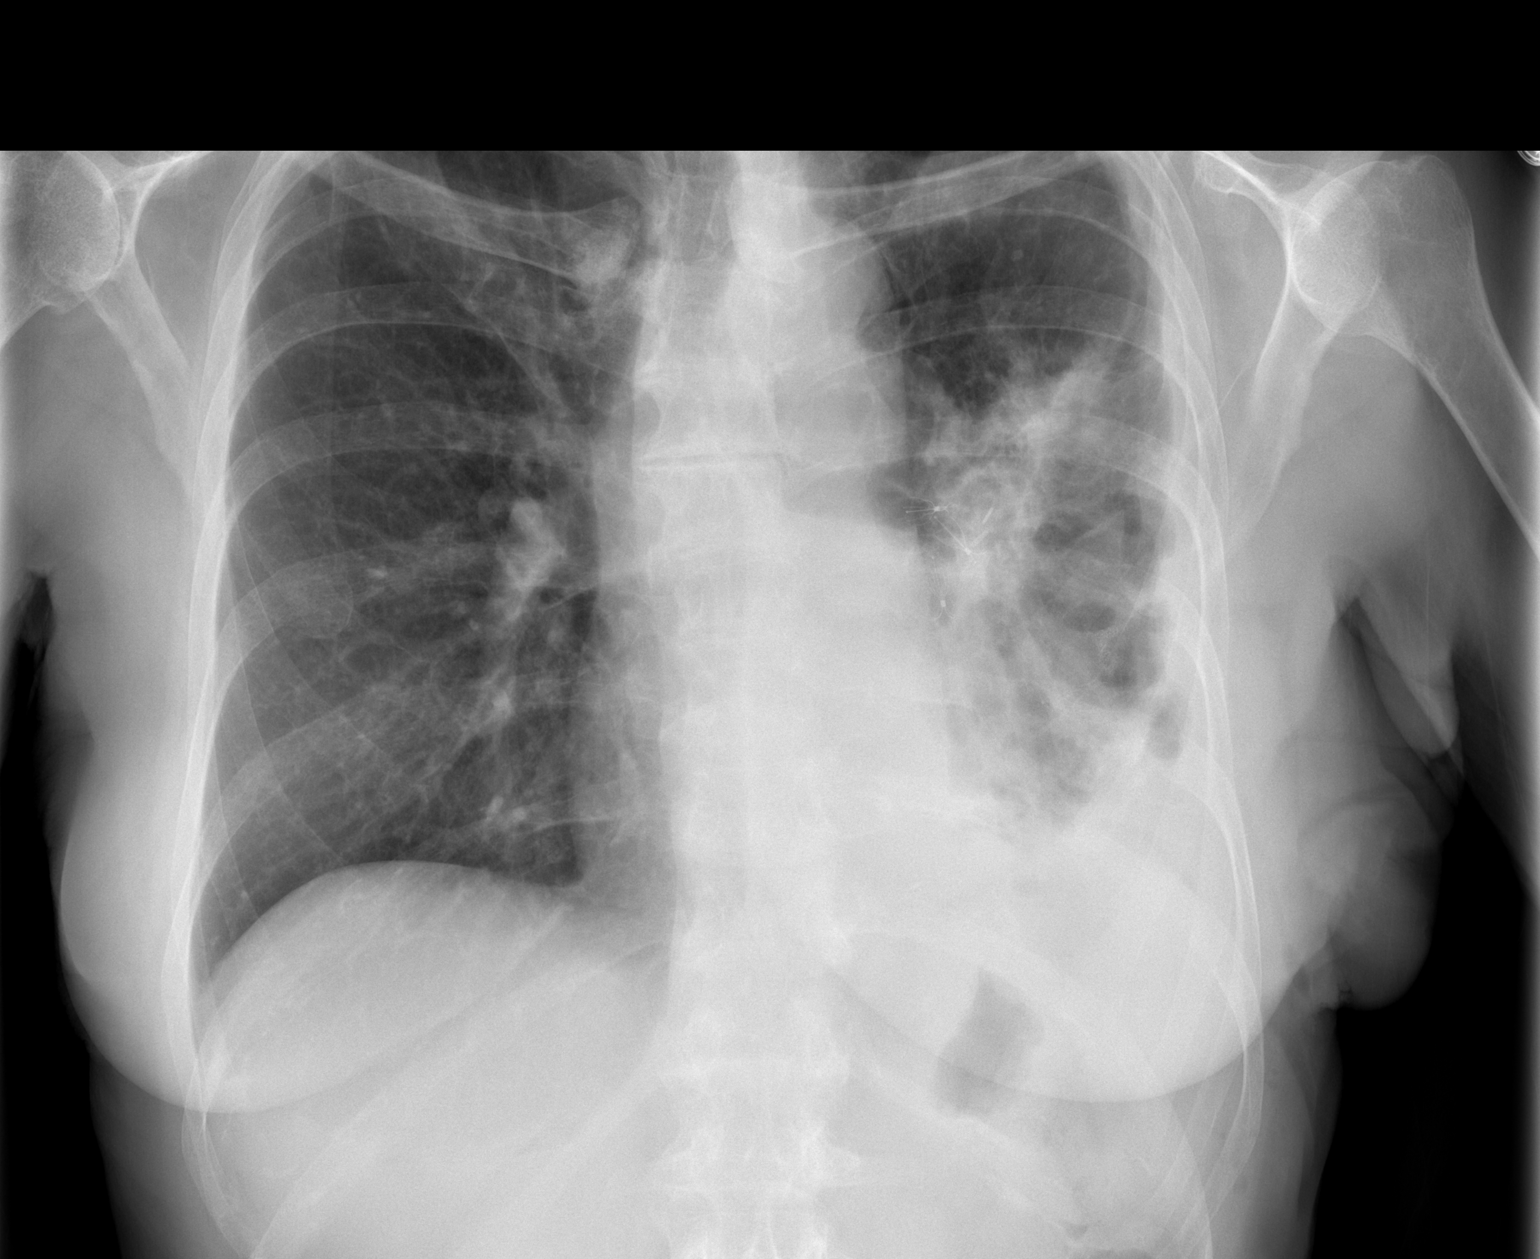

[w chest lat *]
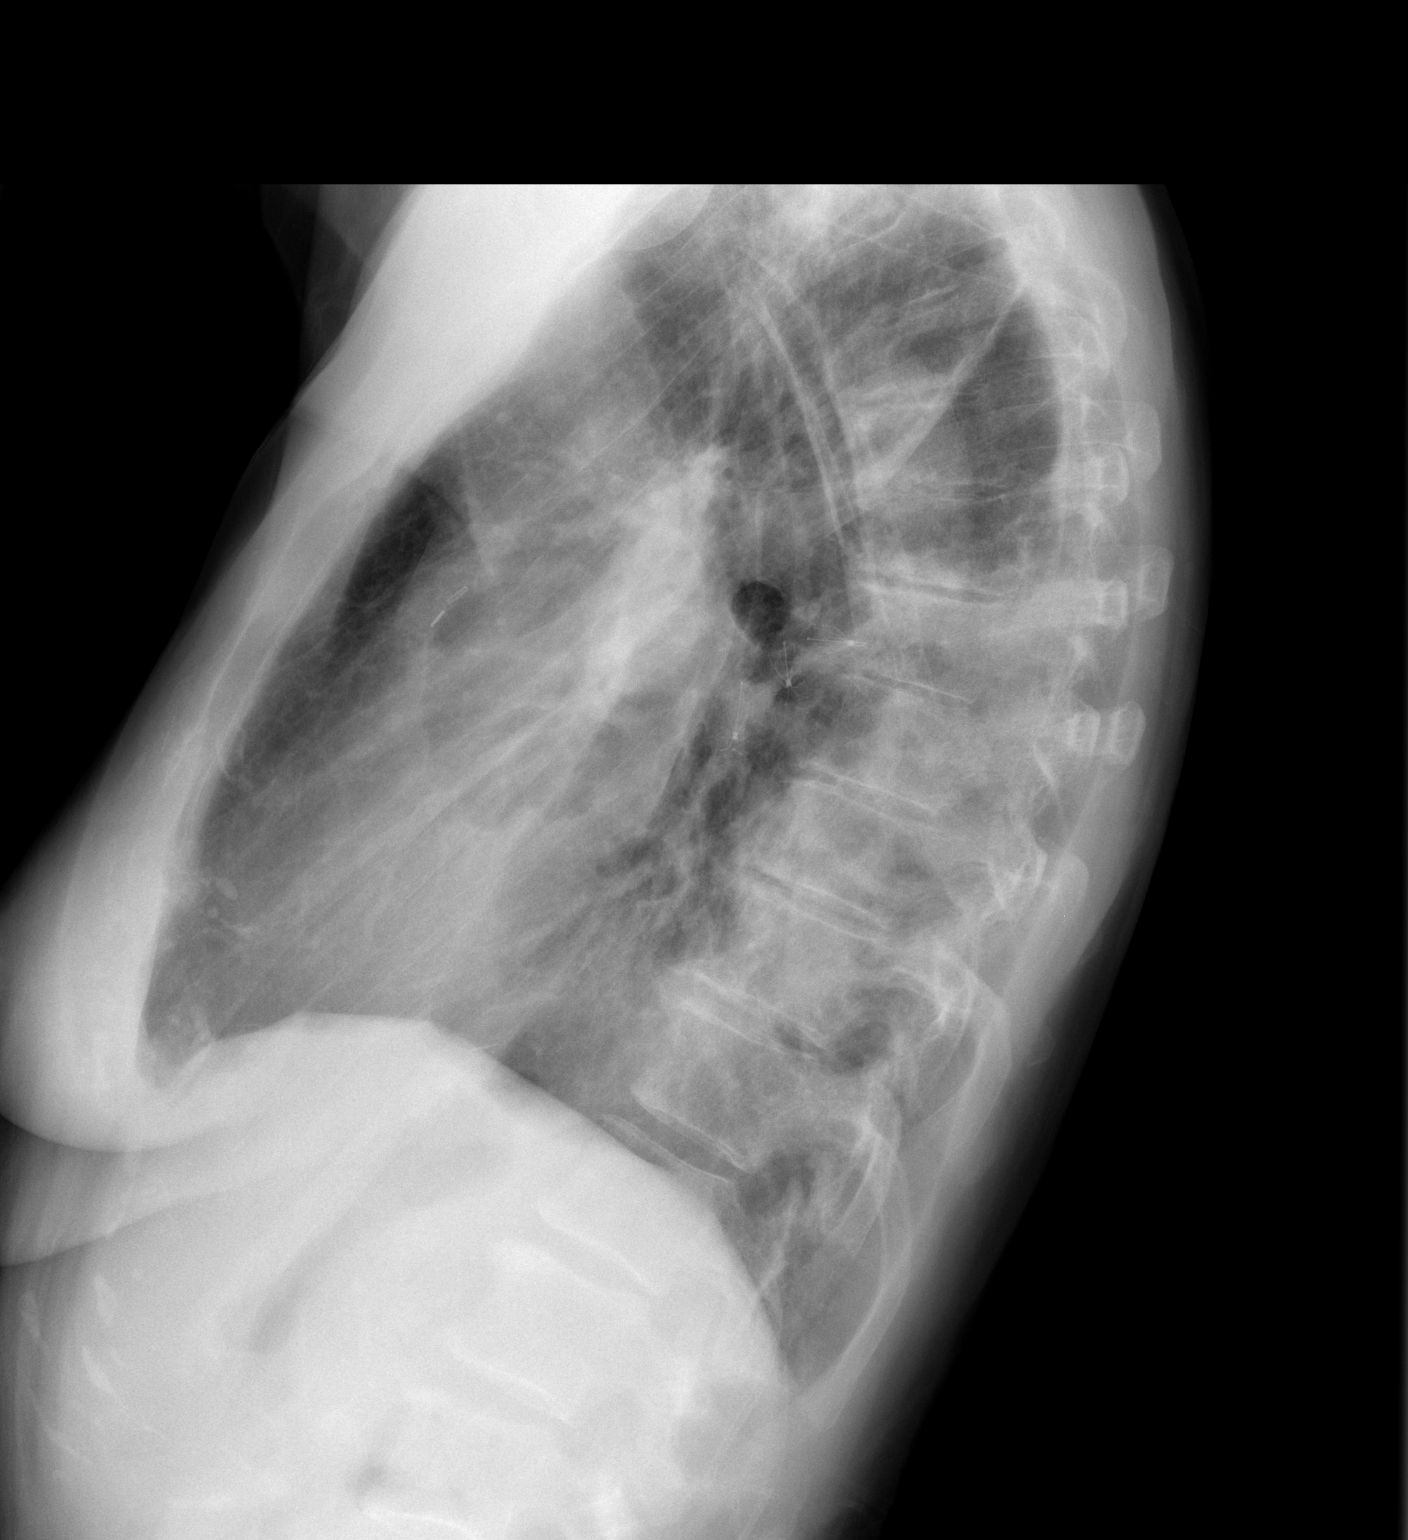

[2 of 2 positions shown; findings below may reference images not displayed]

FINDINGS: The left-sided chest tube has been removed.  There is no
pneumothorax.  There is been no significant change in the left
effusion and areas of infiltrate and atelectasis.  There are a few
small bubbles of air in the lung parenchyma at the left lung base.

The right lung is clear.  Heart size and vascularity are normal.
IMPRESSION: No pneumothorax after chest tube removal.  No change in the
atelectasis and effusion at the left base.

## 2012-04-14 ENCOUNTER — Other Ambulatory Visit: Payer: Self-pay | Admitting: Internal Medicine

## 2012-04-14 DIAGNOSIS — Z1231 Encounter for screening mammogram for malignant neoplasm of breast: Secondary | ICD-10-CM

## 2012-05-11 ENCOUNTER — Ambulatory Visit (HOSPITAL_COMMUNITY)
Admission: RE | Admit: 2012-05-11 | Discharge: 2012-05-11 | Disposition: A | Discharge status: Home / Self Care | Payer: 59 | Source: Ambulatory Visit | Attending: Internal Medicine | Admitting: Internal Medicine

## 2012-05-11 DIAGNOSIS — Z1231 Encounter for screening mammogram for malignant neoplasm of breast: Secondary | ICD-10-CM | POA: Insufficient documentation

## 2012-05-24 ENCOUNTER — Encounter: Payer: Self-pay | Admitting: Internal Medicine

## 2012-05-24 ENCOUNTER — Ambulatory Visit (INDEPENDENT_AMBULATORY_CARE_PROVIDER_SITE_OTHER): Payer: 59 | Admitting: Internal Medicine

## 2012-05-24 VITALS — BP 122/78 | HR 63 | Temp 96.9°F | Ht 65.0 in | Wt 140.0 lb

## 2012-05-24 DIAGNOSIS — M5416 Radiculopathy, lumbar region: Secondary | ICD-10-CM

## 2012-05-24 DIAGNOSIS — M810 Age-related osteoporosis without current pathological fracture: Secondary | ICD-10-CM

## 2012-05-24 DIAGNOSIS — L259 Unspecified contact dermatitis, unspecified cause: Secondary | ICD-10-CM

## 2012-05-24 DIAGNOSIS — IMO0002 Reserved for concepts with insufficient information to code with codable children: Secondary | ICD-10-CM

## 2012-05-24 DIAGNOSIS — L309 Dermatitis, unspecified: Secondary | ICD-10-CM

## 2012-05-24 MED ORDER — IBUPROFEN 200 MG PO CAPS: 400.0000 mg | ORAL_CAPSULE | Freq: Three times a day (TID) | ORAL | Status: DC | PRN

## 2012-05-24 MED ORDER — TRIAMCINOLONE ACETONIDE 0.1 % EX OINT: TOPICAL_OINTMENT | Freq: Two times a day (BID) | CUTANEOUS | Status: DC | PRN

## 2012-05-24 NOTE — Assessment & Plan Note (Signed)
On Evista therapy and takes vitamin D plus calcium for same DEXA 12/2007 and 03/29/12: -1.5 No history fracture, no bone pain. Active with weightbearing activity Check Vit D next labs

## 2012-05-24 NOTE — Patient Instructions (Signed)
It was good to see you today. USe steroid ointment for itch/rash as needed -Your prescription(s) have been submitted to your pharmacy. Please take as directed and contact our office if you believe you are having problem(s) with the medication(s). Ok for ibuprofen for back/leg pain as needed and back exercises Back Exercises Back exercises help treat and prevent back injuries. The goal of back exercises is to increase the strength of your abdominal and back muscles and the flexibility of your back. These exercises should be started when you no longer have back pain. Back exercises include:  Pelvic Tilt. Lie on your back with your knees bent. Tilt your pelvis until the lower part of your back is against the floor. Hold this position 5 to 10 sec and repeat 5 to 10 times.   Knee to Chest. Pull first 1 knee up against your chest and hold for 20 to 30 seconds, repeat this with the other knee, and then both knees. This may be done with the other leg straight or bent, whichever feels better.   Sit-Ups or Curl-Ups. Bend your knees 90 degrees. Start with tilting your pelvis, and do a partial, slow sit-up, lifting your trunk only 30 to 45 degrees off the floor. Take at least 2 to 3 seconds for each sit-up. Do not do sit-ups with your knees out straight. If partial sit-ups are difficult, simply do the above but with only tightening your abdominal muscles and holding it as directed.   Hip-Lift. Lie on your back with your knees flexed 90 degrees. Push down with your feet and shoulders as you raise your hips a couple inches off the floor; hold for 10 seconds, repeat 5 to 10 times.   Back arches. Lie on your stomach, propping yourself up on bent elbows. Slowly press on your hands, causing an arch in your low back. Repeat 3 to 5 times. Any initial stiffness and discomfort should lessen with repetition over time.   Shoulder-Lifts. Lie face down with arms beside your body. Keep hips and torso pressed to floor as you  slowly lift your head and shoulders off the floor.  Do not overdo your exercises, especially in the beginning. Exercises may cause you some mild back discomfort which lasts for a few minutes; however, if the pain is more severe, or lasts for more than 15 minutes, do not continue exercises until you see your caregiver. Improvement with exercise therapy for back problems is slow.   See your caregivers for assistance with developing a proper back exercise program. Document Released: 07/01/2004 Document Revised: 08/16/2011 Document Reviewed: 03/25/2011 [facility] Patient Information 2013 [place], [place].

## 2012-05-24 NOTE — Progress Notes (Signed)
  Subjective:    Patient ID: Terry Robbins, female    DOB: 05/01/1934, 78 y.o.   MRN: [id number]  HPI  complains of RLE pain, no injury Also rash/itch R>L anterior shin at sock line  Past Medical History  Diagnosis Date  . ARTHRITIS   . MACULAR DEGENERATION   . Empyema, left 04/2010    related to PNA; s/p VATS/minithoracotomy & bronch  . OA (osteoarthritis)   . OSTEOPOROSIS   . Psoriasis     Review of Systems  Constitutional: Negative for fever and fatigue.  Respiratory: Negative for cough and shortness of breath.   Cardiovascular: Negative for chest pain and palpitations.       Objective:   Physical Exam BP 122/78  Pulse 63  Temp 96.9 F (36.1 C) (Oral)  Ht 5\' 5"  (1.651 m)  Wt 140 lb (63.504 kg)  BMI 23.30 kg/m2  SpO2 97% Wt Readings from Last 3 Encounters:  05/24/12 140 lb (63.504 kg)  03/10/12 138 lb (62.596 kg)  04/26/11 141 lb 1.3 oz (63.993 kg)   Constitutional: She appears well-developed and well-nourished. No distress.  Cardiovascular: Normal rate, regular rhythm and normal heart sounds.  No murmur heard. No BLE edema. Pulmonary/Chest: Effort normal and breath sounds normal. No respiratory distress. She has no wheezes.  Musculoskeletal: Back: full range of motion of thoracic and lumbar spine. Non tender to palpation. Negative straight leg raise. DTR's are symmetrically intact. Sensation intact in all dermatomes of the lower extremities. Full strength to manual muscle testing. patient is able to heel toe walk without difficulty and ambulates with antalgic gait. Neurological: She is alert and oriented to person, place, and time. No cranial nerve deficit. Coordination normal.  Skin: Skin is warm and dry. No rash noted. No erythema.  Psychiatric: She has a normal mood and affect. Her behavior is normal. Judgment and thought content normal.   Lab Results  Component Value Date   WBC 6.2 03/13/2012   HGB 14.4 03/13/2012   HCT 42.6 03/13/2012   PLT 235.0 03/13/2012    GLUCOSE 90 03/13/2012   CHOL 199 03/13/2012   TRIG 81.0 03/13/2012   HDL 86.30 03/13/2012   LDLCALC 97 03/13/2012   ALT 14 03/13/2012   AST 16 03/13/2012   NA 142 03/13/2012   K 4.2 03/13/2012   CL 105 03/13/2012   CREATININE 0.8 03/13/2012   BUN 22 03/13/2012   CO2 31 03/13/2012   TSH 1.61 03/13/2012   INR 1.06 05/29/2010   HGBA1C 5.6 03/13/2012       Assessment & Plan:   See problem list. Medications and labs reviewed today.  Eczema anterior shin - rx steroid prn, not typical of psoriasis  RLE pain from lumbar radiculopathy - suspect underlying DDD/spinal stenosis given age - no neuro deficits on exam - advised before prn and back PT - will do home exercise program 1st - pt agrees to call if worse or unimproved for formal PT or imaging as needed  Time spent with pt today 25 minutes, greater than 50% time spent counseling patient on rash, leg pain, osteopenia and medication review. Also review of prior records

## 2012-07-20 ENCOUNTER — Other Ambulatory Visit: Payer: Self-pay | Admitting: Internal Medicine

## 2012-12-19 ENCOUNTER — Encounter: Payer: Self-pay | Admitting: Internal Medicine

## 2012-12-19 ENCOUNTER — Ambulatory Visit (INDEPENDENT_AMBULATORY_CARE_PROVIDER_SITE_OTHER)
Admission: RE | Admit: 2012-12-19 | Discharge: 2012-12-19 | Disposition: A | Discharge status: Home / Self Care | Payer: 59 | Source: Ambulatory Visit | Attending: Internal Medicine | Admitting: Internal Medicine

## 2012-12-19 ENCOUNTER — Ambulatory Visit (INDEPENDENT_AMBULATORY_CARE_PROVIDER_SITE_OTHER): Payer: 59 | Admitting: Internal Medicine

## 2012-12-19 VITALS — BP 130/78 | HR 70 | Temp 97.5°F | Wt 135.0 lb

## 2012-12-19 DIAGNOSIS — M79604 Pain in right leg: Secondary | ICD-10-CM

## 2012-12-19 DIAGNOSIS — M5416 Radiculopathy, lumbar region: Secondary | ICD-10-CM

## 2012-12-19 DIAGNOSIS — IMO0002 Reserved for concepts with insufficient information to code with codable children: Secondary | ICD-10-CM

## 2012-12-19 DIAGNOSIS — M79609 Pain in unspecified limb: Secondary | ICD-10-CM

## 2012-12-19 MED ORDER — PREDNISONE (PAK) 10 MG PO TABS: 10.0000 mg | ORAL_TABLET | ORAL | Status: DC

## 2012-12-19 NOTE — Patient Instructions (Signed)
It was good to see you today. We have reviewed your prior records including labs and tests today X-ray today of your back in our office -results will be released to my chart after review Will refer for MRI of your back to evaluate for pinched nerve as discussed. My office will call regarding this appointment, you will then be notified regarding results after review Begin prednisone taper over next 6 days to control inflammation and pain - Your prescription(s) have been submitted to your pharmacy. Please take as directed and contact our office if you believe you are having problem(s) with the medication(s). Further treatment and evaluation will depend on test results and her symptoms, call sooner if worse

## 2012-12-19 NOTE — Progress Notes (Signed)
Subjective:    Patient ID: Terry Robbins, female    DOB: 1933/07/13, 79 y.o.   MRN: [id number]  HPI  See CC - complains of R leg pain  Hx prior sciatica in same leg with nocturnal symptoms which improved over 6 mo time conservative care Now >1 week intense R leg pain while standing walking located R proximal calf just distal to but not involving knee Denies fall or truama No joint swelling or tissue swelling No rash or bruise No falls but increasing weakness - "thought my leg was going to give way" last PM standing in kitchen   Past Medical History  Diagnosis Date  . ARTHRITIS   . MACULAR DEGENERATION   . Empyema, left 04/2010    related to PNA; s/p VATS/minithoracotomy & bronch  . OA (osteoarthritis)   . OSTEOPOROSIS   . Psoriasis     Review of Systems  Respiratory: Negative for cough and shortness of breath.   Cardiovascular: Negative for chest pain and leg swelling.  Musculoskeletal: Positive for gait problem. Negative for back pain and joint swelling.       Objective:   Physical Exam BP 130/78  Pulse 70  Temp(Src) 97.5 F (36.4 C) (Oral)  Wt 135 lb (61.236 kg)  BMI 22.47 kg/m2  SpO2 95% Wt Readings from Last 3 Encounters:  12/19/12 135 lb (61.236 kg)  05/24/12 140 lb (63.504 kg)  03/10/12 138 lb (62.596 kg)   Constitutional: She appears well-developed and well-nourished. No distress.  HENT: Head: Normocephalic and atraumatic. Ears: B TMs ok, no erythema or effusion; Nose: Nose normal. Mouth/Throat: Oropharynx is clear and moist. No oropharyngeal exudate.  Eyes: Conjunctivae and EOM are normal. Pupils are equal, round, and reactive to light. No scleral icterus.  Neck: Normal range of motion. Neck supple. No JVD present. No thyromegaly present.  Cardiovascular: Normal rate, regular rhythm and normal heart sounds.  No murmur heard. No BLE edema. Pulmonary/Chest: Effort normal and breath sounds normal. No respiratory distress. She has no wheezes.   Musculoskeletal: Back: full range of motion of thoracic and lumbar spine. Non tender to palpation. positive ipsilateral straight leg raise. DTR's are symmetrically intact. Sensation intact in all dermatomes of the lower extremities. Diminished R hip flexor strength to manual muscle testing. patient is able to heel toe walk with difficulty and ambulates with antalgic but unaided gait. Neurological: She is alert and oriented to person, place, and time. No cranial nerve deficit. Coordination, balance, strength, and speech are normal.  Skin: Skin is warm and dry. No rash noted. No erythema.  Psychiatric: She has a normal mood and affect. Her behavior is normal. Judgment and thought content normal.   Lab Results  Component Value Date   WBC 6.2 03/13/2012   HGB 14.4 03/13/2012   HCT 42.6 03/13/2012   PLT 235.0 03/13/2012   GLUCOSE 90 03/13/2012   CHOL 199 03/13/2012   TRIG 81.0 03/13/2012   HDL 86.30 03/13/2012   LDLCALC 97 03/13/2012   ALT 14 03/13/2012   AST 16 03/13/2012   NA 142 03/13/2012   K 4.2 03/13/2012   CL 105 03/13/2012   CREATININE 0.8 03/13/2012   BUN 22 03/13/2012   CO2 31 03/13/2012   TSH 1.61 03/13/2012   INR 1.06 05/29/2010   HGBA1C 5.6 03/13/2012       Assessment & Plan:    R leg pain - consistent with acute on chronic lumbar radiculopathy Motor deficit noted in L4 region Check DG and MRI pred  taper x 6 days for anti inflammation Suspect will need specialist evaluation for further treatment

## 2012-12-27 ENCOUNTER — Telehealth: Payer: Self-pay | Admitting: Internal Medicine

## 2012-12-27 DIAGNOSIS — M79604 Pain in right leg: Secondary | ICD-10-CM

## 2012-12-27 DIAGNOSIS — M5416 Radiculopathy, lumbar region: Secondary | ICD-10-CM

## 2012-12-27 NOTE — Telephone Encounter (Signed)
Dr. Felicity Coyer patient  MRI LUMBAR SPINE WO CONTRAST was denied by insurance -there is an option that you can do you can speak to a medical director is available to speak with you about this determination  You may do so by calling [phone] ( option) - case #[id number] (cpt code ) [id number].I also faxed clinical over and still denied.  I  Put  the denial letter in basket in office . I informed pt of this denial letter she wanted to know what the next step in care since her  mri was denied please advise thank you

## 2012-12-27 NOTE — Telephone Encounter (Signed)
I will instead refer to a spine specialist for evaluation and tx as they can help [place] arrange MRI and therapy/tx options

## 2013-01-23 ENCOUNTER — Encounter: Payer: Self-pay | Admitting: Internal Medicine

## 2013-01-23 ENCOUNTER — Ambulatory Visit (INDEPENDENT_AMBULATORY_CARE_PROVIDER_SITE_OTHER): Payer: 59 | Admitting: Internal Medicine

## 2013-01-23 VITALS — BP 130/70 | HR 70 | Temp 97.7°F | Wt 134.1 lb

## 2013-01-23 DIAGNOSIS — IMO0002 Reserved for concepts with insufficient information to code with codable children: Secondary | ICD-10-CM

## 2013-01-23 DIAGNOSIS — M7050 Other bursitis of knee, unspecified knee: Secondary | ICD-10-CM

## 2013-01-23 DIAGNOSIS — M545 Low back pain: Secondary | ICD-10-CM

## 2013-01-23 MED ORDER — CYCLOBENZAPRINE HCL 5 MG PO TABS: 5.0000 mg | ORAL_TABLET | Freq: Every evening | ORAL | Status: DC | PRN

## 2013-01-23 NOTE — Progress Notes (Signed)
Subjective:    Patient ID: Terry Robbins, female    DOB: May 20, 1934, 79 y.o.   MRN: [id number]  HPI  Patient presents today with aching pain of right medial leg over the pes anserine bursa x 1 week. The pain is associated with mild swelling. It is worse when she walks and flexes her hip; it is not relieved with sitting, but was relieved with ibuprofen. She was treated with prednisone taper for sciatica of her right leg almost 3 weeks ago, which she states has resolved. This new pain came on after she was able to restart her exercise routine, and she vaguely remembers hitting her leg recently. She denies pain in her knee joint.  Additionally, she complains of a sliding sensation in her left lower back brought on by bending over that began approximately the same time as the leg pain. She has associated mild, intermittent dull aching. She denies difficulty with gait, sensory deficits, pain in her spine, numbness, and tingling.  Past Medical History  Diagnosis Date  . ARTHRITIS   . MACULAR DEGENERATION   . Empyema, left 04/2010    related to PNA; s/p VATS/minithoracotomy & bronch  . OA (osteoarthritis)   . OSTEOPOROSIS   . Psoriasis    Family History  Problem Relation Age of Onset  . Diabetes Other     Parent  . Hypertension Other     parent  . Dementia Other     Parent  . Lung cancer Other     Parent   History  Substance Use Topics  . Smoking status: Former Smoker    Types: Cigarettes    Quit date: 06/07/1977  . Smokeless tobacco: Not on file     Comment: Divorced, lives alone- Very active at [employer], church. retired from [employer] 2006  . Alcohol Use: No    Review of Systems  Constitutional: Negative for fever and fatigue.  Musculoskeletal: Positive for back pain. Negative for joint swelling.  Skin: Negative for rash.  Neurological: Negative for weakness.  Otherwise negative or as stated in HPI     Objective:   Physical Exam  Constitutional: She is oriented to person,  place, and time. She appears well-developed and well-nourished.  HENT:  Head: Normocephalic and atraumatic.  Eyes: Conjunctivae and EOM are normal. Pupils are equal, round, and reactive to light.  Musculoskeletal:  Mild edema and tenderness over pes anserine bursa Full and equal strength and ROM of lower extremities. Sensation of lower extremities is intact   No gait abnormality  No abnormality palpated of bilateral lower back. Lower back and spine are nontender to palpation.  Neurological: She is alert and oriented to person, place, and time. She displays normal reflexes. No cranial nerve deficit. Coordination normal.  Skin: Skin is warm and dry. No rash noted.  Psychiatric: She has a normal mood and affect.   Lab Results  Component Value Date   WBC 6.2 03/13/2012   HGB 14.4 03/13/2012   HCT 42.6 03/13/2012   PLT 235.0 03/13/2012   GLUCOSE 90 03/13/2012   CHOL 199 03/13/2012   TRIG 81.0 03/13/2012   HDL 86.30 03/13/2012   LDLCALC 97 03/13/2012   ALT 14 03/13/2012   AST 16 03/13/2012   NA 142 03/13/2012   K 4.2 03/13/2012   CL 105 03/13/2012   CREATININE 0.8 03/13/2012   BUN 22 03/13/2012   CO2 31 03/13/2012   TSH 1.61 03/13/2012   INR 1.06 05/29/2010   HGBA1C 5.6 03/13/2012  Procedure: bursa injection, pes anserine Indication: pes anserine bursitis the patient elects to proceed after verbal consent is obtained. the patient informed of possible risks and complications. Using sterile technique throughout, pt is injected in area of maximal tenderness into the right pes anserine attachment. 1:1 depo medrol 80mg : 1% lidocaine without epi. the patient tolerated the procedure well. Aftercare instructions provided.      Assessment & Plan:  1. Pes anserine bursitis- Bursa was injected with lidocaine and depo-medrol. Procedure was explained to patient and she agreed.  2. Muscle spasm of left lower back- exam benign. Acute strain possibly related to chronic back issues. Patient was given back  exercises as well as flexeril. She was advised to take OTC ibuprofen prn for back discomfort.  She is advised to RTC if symptoms worsen.  Concha Se, [place]ighbor   I have personally reviewed this case with PA student. I also personally examined this patient. I agree with history and findings as documented above. I reviewed, discussed and approve of the assessment and plan as listed above. Rene Paci, MD

## 2013-01-23 NOTE — Patient Instructions (Signed)
It was good to see you today. We have reviewed your prior records including labs and tests today We injected your right pes anserine bursa today to help with the knee pain - see instructions below Use Flexeril muscle relaxer at night in addition to ibuprofen as needed for back pain Your prescription(s) have been submitted to your pharmacy. Please take as directed and contact our office if you believe you are having problem(s) with the medication(s).  Pes Anserinus Syndrome with Rehab The pes anserine, also known as the goose's foot, is an area of the shinbone (tibia) near the knee joint where the tendons of three of the muscles of the thigh insert into the bone. These muscles are important for bending the knee and bringing the leg across the body. Just underneath the three tendons that attach at the pes anserinus exists a fluid filled sac (bursa) that is meant to reduce the friction between the tendons and the tibia. Pes anserinus syndrome is a condition that is characterized by inflammation of the bursa (bursitis) and/ or tendonitis (inflammation of the tendon) and may cause severe pain in the lower portion of the inner (medial) side of the knee. SYMPTOMS   Pain and inflammation over the lower portion of the medial side of the knee.  Pain that worsens as the duration of an activity increases.  Pain that worsens when bending the knee, especially against resistance.  A crackling sound (crepitation) when the tendon or bursa is moved or touched. CAUSES  Bursitis and tendonitis are usually characterized as overuse injuries. Common mechanisms of injury include:  Stress placed on the knee from a sudden increase in the intensity, frequency, or duration of training.  Direct trauma to the upper leg (less common). RISK INCREASES WITH:  Endurance sports (distance running or triathletes).  Making changes to or beginning a new training program.  Sports that place stress on the muscles that insert at  the pes anserinus, such as those that require pivoting, cutting, or jumping.  Improper training.  Poor strength and flexibility  Failure to warm-up properly before activity.  Improper knee alignment ( knock knees).  Arthritis of the knee. PREVENTION  Warm up and stretch properly before activity.  Allow for adequate recovery between workouts.  Maintain physical fitness:  Strength, flexibility, and endurance.  Cardiovascular fitness.  Learn and use training methods that will reduce the stress placed on the pes anserinus.  Arch supports (orthotics) may be helpful for those with flat feet. PROGNOSIS  If treated properly, then the symptoms of pes anserinus syndrome usually resolve within 6 weeks.  RELATED COMPLICATIONS   Persistent and potentially chronic pain if the condition is not treated properly.  Re-injury if activity is resumed before the injury is allowed to heal completely, or if one resumes improper training habits. TREATMENT Treatment initially involves the use of ice and medication to help reduce pain and inflammation. The use of strengthening and stretching exercises may help reduce pain with activity. These exercises may be performed at home or with a therapist. Individuals who have flat feet may find benefit in wearing arch supports in their shoes. Some individuals find that compression bandages or knee sleeves help reduce symptoms. Your caregiver may recommend a corticosteroid injection to help reduce inflammation. If symptoms persist, despite conservative treatment for greater than 6 months, then surgery may be recommended.  MEDICATION   If pain medication is necessary, then nonsteroidal anti-inflammatory medications, such as aspirin and ibuprofen, or other minor pain relievers, such as acetaminophen,  are often recommended.  Do not take pain medication for 7 days before surgery.  Prescription pain relievers may be given if deemed necessary by your caregiver. Use  only as directed and only as much as you need.  Corticosteroid injections may be given by your caregiver. These injections should be reserved for the most serious cases, because they may only be given a certain number of times. SEEK MEDICAL CARE IF:  Treatment seems to offer no benefit, or the condition worsens.  Any medications produce adverse side effects. EXERCISES  RANGE OF MOTION (ROM) AND STRETCHING EXERCISES - Pes Anserinus Syndrome These exercises may help you when beginning to rehabilitate your injury. Your symptoms may resolve with or without further involvement from your physician, physical therapist or athletic trainer. While completing these exercises, remember:   Restoring tissue flexibility helps normal motion to return to the joints. This allows healthier, less painful movement and activity.  An effective stretch should be held for at least 30 seconds.  A stretch should never be painful. You should only feel a gentle lengthening or release in the stretched tissue. STRETCH  Hamstrings, Supine  Lie on your back. Loop a belt or towel over the ball of your right / left foot.  Straighten your right / left knee and slowly pull on the belt to raise your leg. Do not allow the right / left knee to bend. Keep your opposite leg flat on the floor.  Raise the leg until you feel a gentle stretch behind your right / left knee or thigh. Hold this position for __________ seconds. Repeat __________ times. Complete this stretch __________ times per day.  STRETCH - Hamstrings, Doorway  Lie on your back with your right / left leg extended and resting on the wall and the opposite leg flat on the ground through the door. Initially, position your bottom farther away from the wall than the illustration shows.  Keep your right / left knee straight. If you feel a stretch behind your knee or thigh, hold this position for __________ seconds.  If you do not feel a stretch, scoot your bottom closer  to the door, and hold __________ seconds. Repeat __________ times. Complete this stretch __________ times per day.  STRETCH - Hamstrings/Adductors, V-Sit  Sit on the floor with your legs extended in a large "V," keeping your knees straight.  With your head and chest upright, bend at your waist reaching for your right foot to stretch your left adductors.  You should feel a stretch in your left inner thigh. Hold for __________ seconds.  Return to the upright position to relax your leg muscles.  Continuing to keep your chest upright, bend straight forward at your waist to stretch your hamstrings.  You should feel a stretch behind both of your thighs and/or knees. Hold for __________ seconds.  Return to the upright position to relax your leg muscles.  Repeat steps 2 through 4. Repeat __________ times. Complete this exercise __________ times per day.  STRETCH - Hamstrings, Standing  Stand or sit and extend your right / left leg, placing your foot on a chair or foot stool  Keeping a slight arch in your low back and your hips straight forward.  Lead with your chest and lean forward at the waist until you feel a gentle stretch in the back of your right / left knee or thigh. (When done correctly, this exercise requires leaning only a small distance.)  Hold this position for __________ seconds. Repeat __________ times.  Complete this stretch __________ times per day. STRETCH - Adductors, Lunge  While standing, spread your legs  Lean away from your right / left leg by bending your opposite knee. You may rest your hands on your thigh for balance.  You should feel a stretch in your right / left inner thigh. Hold for __________ seconds. Repeat __________ times. Complete this exercise __________ times per day.  STRETCH - Adductors, Standing  Place your right / left foot on a counter or stable table. Turn away from your leg so both hips line up with your right / left leg.  Keeping your hips  facing forward, slowly bend your opposite leg until you feel a gentle stretch on the inside of your right / left thigh.  Hold for __________ seconds. Repeat __________ times. Complete this exercise __________ times per day.  STRENGTHENING EXERCISES - Pes Anserinus Syndrome  These exercises may help you when beginning to rehabilitate your injury. They may resolve your symptoms with or without further involvement from your physician, physical therapist or athletic trainer. While completing these exercises, remember:   Muscles can gain both the endurance and the strength needed for everyday activities through controlled exercises.  Complete these exercises as instructed by your physician, physical therapist or athletic trainer. Progress the resistance and repetitions only as guided. STRENGTH - Hamstring, Curls  Lay on your stomach with your legs extended. (If you lay on a bed, your feet may hang over the edge.)  Tighten the muscles in the back of your thigh to bend your right / left knee up to 90 degrees. Keep your hips flat on the bed/floor.  Hold this position for __________ seconds.  Slowly lower your leg back to the starting position. Repeat __________ times. Complete this exercise __________ times per day.  OPTIONAL ANKLE WEIGHTS: Begin with ____________________, but DO NOT exceed ____________________. Increase in 1 lb/0.5 kg increments.  STRENGTH - Hip Adductors, Straight Leg Raises  Lie on your side so that your head, shoulders, knee and hip line up. You may place your upper foot in front to help maintain your balance. Your right / left leg should be on the bottom.  Roll your hips slightly forward, so that your hips are stacked directly over each other and your right / left knee is facing forward.  Tense the muscles in your inner thigh and lift your bottom leg 4-6 inches. Hold this position for __________ seconds.  Slowly lower your leg to the starting position. Allow the muscles to  fully relax before beginning the next repetition. Repeat __________ times. Complete this exercise __________ times per day.  Document Released: 05/24/2005 Document Revised: 08/16/2011 Document Reviewed: 09/05/2008 [facility] Patient Information 2014 [place], [place]. Back Exercises Back exercises help treat and prevent back injuries. The goal is to increase your strength in your belly (abdominal) and back muscles. These exercises can also help with flexibility. Start these exercises when told by your doctor. HOME CARE Back exercises include: Pelvic Tilt.  Lie on your back with your knees bent. Tilt your pelvis until the lower part of your back is against the floor. Hold this position 5 to 10 sec. Repeat this exercise 5 to 10 times. Knee to Chest.  Pull 1 knee up against your chest and hold for 20 to 30 seconds. Repeat this with the other knee. This may be done with the other leg straight or bent, whichever feels better. Then, pull both knees up against your chest. Sit-Ups or Curl-Ups.  Bend your knees  90 degrees. Start with tilting your pelvis, and do a partial, slow sit-up. Only lift your upper half 30 to 45 degrees off the floor. Take at least 2 to 3 seonds for each sit-up. Do not do sit-ups with your knees out straight. If partial sit-ups are difficult, simply do the above but with only tightening your belly (abdominal) muscles and holding it as told. Hip-Lift.  Lie on your back with your knees flexed 90 degrees. Push down with your feet and shoulders as you raise your hips 2 inches off the floor. Hold for 10 seconds, repeat 5 to 10 times. Back Arches.  Lie on your stomach. Prop yourself up on bent elbows. Slowly press on your hands, causing an arch in your low back. Repeat 3 to 5 times. Shoulder-Lifts.  Lie face down with arms beside your body. Keep hips and belly pressed to floor as you slowly lift your head and shoulders off the floor. Do not overdo your exercises. Be careful in the  beginning. Exercises may cause you some mild back discomfort. If the pain lasts for more than 15 minutes, stop the exercises until you see your doctor. Improvement with exercise for back problems is slow.  Document Released: 06/26/2010 Document Revised: 08/16/2011 Document Reviewed: 03/25/2011 [facility] Patient Information 2014 [place], [place].

## 2013-01-24 ENCOUNTER — Telehealth: Payer: Self-pay | Admitting: *Deleted

## 2013-01-24 NOTE — Telephone Encounter (Signed)
Received fax pt needing PA for cyclobenzaprine. Completed PA on cover-my-meds. They have approved med fax over approval form. Notified pharmacy spoke with Maisie Fus gave approval status...Raechel Chute

## 2013-03-26 ENCOUNTER — Ambulatory Visit (INDEPENDENT_AMBULATORY_CARE_PROVIDER_SITE_OTHER): Payer: 59 | Admitting: Internal Medicine

## 2013-03-26 ENCOUNTER — Encounter: Payer: Self-pay | Admitting: Internal Medicine

## 2013-03-26 VITALS — BP 120/72 | HR 74 | Temp 97.0°F | Wt 134.0 lb

## 2013-03-26 DIAGNOSIS — T50905A Adverse effect of unspecified drugs, medicaments and biological substances, initial encounter: Secondary | ICD-10-CM

## 2013-03-26 DIAGNOSIS — T887XXA Unspecified adverse effect of drug or medicament, initial encounter: Secondary | ICD-10-CM

## 2013-03-26 DIAGNOSIS — L500 Allergic urticaria: Secondary | ICD-10-CM

## 2013-03-26 MED ORDER — PREDNISONE (PAK) 10 MG PO TABS: 10.0000 mg | ORAL_TABLET | ORAL | Status: DC

## 2013-03-26 NOTE — Patient Instructions (Addendum)
It was good to see you today.  We have reviewed your prior records including labs and tests today  We have updated your allergy list to include cephalosporin in this class of antibiotics - we will avoid use of this type of antibiotic in the future  Medications reviewed and updated -prednisone pack to use if recurrence of rash -otherwise no treatment changes recommended  If recurrent rash or recurrent problems, call for referral to allergy specialist as neededDrug Allergy A drug allergy means you have a strange reaction to a medicine. You may have puffiness (swelling), itching, red rashes, and hives. Some allergic reactions can be life-threatening. HOME CARE  If you do not know what caused your reaction:  Write down medicines you use.  Write down any problems you have after using medicine.  Avoid things that cause a reaction.  You can see an allergy doctor to be tested for allergies. If you have hives or a rash:  Take medicine as told by your doctor.  Place cold cloths on your skin.  Do not take hot baths or hot showers. Take baths in cool water. If you are severely allergic:  Wear a medical bracelet or necklace that lists your allergy.  Carry your allergy kit or medicine shot to treat severe allergic reactions with you. These can save your life.  Do not drive until medicine from your shot has worn off, unless your doctor says it is okay. GET HELP RIGHT AWAY IF:   Your mouth is puffy, or you have trouble breathing.  You have a tight feeling in your chest or throat.  You have hives, puffiness, or itching all over your body.  You throw up (vomit) or have watery poop (diarrhea).  You feel dizzy or pass out (faint).  You think you are having a reaction. Problems often start within 30 minutes after taking a medicine.  You are getting worse, not better.  You have new problems.  Your problems go away and then come back. This is an emergency. Use your medicine shot or  allergy kit as told. Call yourlocal emergency services (911 in [place] after the shot. Even if you feel better after the shot, you need to go to the hospital. You may need more medicine to control a severe reaction. MAKE SURE YOU:  Understand these instructions.  Will watch your condition.  Will get help right away if you are not doing well or get worse. Document Released: 07/01/2004 Document Revised: 08/16/2011 Document Reviewed: 11/19/2010 [facility] Patient Information 2014 [place], [place].

## 2013-03-26 NOTE — Progress Notes (Signed)
  Subjective:    Patient ID: Terry Robbins, female    DOB: [birth year], 79 y.o.   MRN: [id number]  Rash This is a new problem. The current episode started in the past 7 days. The problem has been gradually improving since onset. The rash is diffuse. The rash is characterized by redness and itchiness. She was exposed to a new medication. Pertinent negatives include no anorexia, cough, diarrhea, facial edema, fatigue, fever, joint pain or shortness of breath. Past treatments include antihistamine. The treatment provided significant relief. There is no history of allergies, asthma or eczema.  She had been taking Keflex for the past 10 days post orthodontic surgery. Last dose 10-16.   Past Medical History  Diagnosis Date  . ARTHRITIS   . MACULAR DEGENERATION   . Empyema, left 04/2010    related to PNA; s/p VATS/minithoracotomy & bronch  . OA (osteoarthritis)   . OSTEOPOROSIS   . Psoriasis      Review of Systems  Constitutional: Negative for fever, chills, activity change and fatigue.  HENT: Negative.   Eyes: Negative.   Respiratory: Negative for cough, chest tightness and shortness of breath.   Cardiovascular: Negative for chest pain, palpitations and leg swelling.  Gastrointestinal: Negative for diarrhea and anorexia.  Musculoskeletal: Negative for joint pain.  Skin: Positive for rash.       Objective:   Physical Exam  Constitutional: She is oriented to person, place, and time. She appears well-developed and well-nourished. No distress.  Neck: Normal range of motion. Neck supple. No thyromegaly present.  Cardiovascular: Normal rate and regular rhythm.  Exam reveals no gallop.   No murmur heard. Pulmonary/Chest: Effort normal and breath sounds normal. No respiratory distress. She has no wheezes.  Musculoskeletal: Normal range of motion.  Lymphadenopathy:    She has no cervical adenopathy.  Neurological: She is alert and oriented to person, place, and time.  Skin: Skin is warm and  dry. She is not diaphoretic.  Small areas of erythema on right shoulder and medial aspects of bilateral knees.  No tenderness, edema, rashes.   Psychiatric: She has a normal mood and affect. Her behavior is normal. Judgment and thought content normal.          Assessment & Plan:   Urticaria - likely due to hypersensitivity reaction to Cephalasporin.  Will update allergy list today to reflect same.  Pt advised to continue Benadryl as needed for itching/symptom relief.  Will RX prednisone taper if rash recurs -if recurrent problem requiring prednisone, patient will contact us and we will plan referral to allergist as needed.  Pt also advised to notify orthodontic surgeon of new allergy.

## 2013-04-03 ENCOUNTER — Ambulatory Visit: Payer: 59

## 2013-04-05 ENCOUNTER — Ambulatory Visit (INDEPENDENT_AMBULATORY_CARE_PROVIDER_SITE_OTHER): Payer: 59

## 2013-04-05 DIAGNOSIS — Z23 Encounter for immunization: Secondary | ICD-10-CM

## 2013-04-15 ENCOUNTER — Emergency Department (HOSPITAL_COMMUNITY)
Admission: EM | Admit: 2013-04-15 | Discharge: 2013-04-15 | Disposition: A | Discharge status: Home / Self Care | Payer: 59 | Source: Home / Self Care

## 2013-04-15 ENCOUNTER — Encounter (HOSPITAL_COMMUNITY): Payer: Self-pay | Admitting: Emergency Medicine

## 2013-04-15 DIAGNOSIS — N390 Urinary tract infection, site not specified: Secondary | ICD-10-CM

## 2013-04-15 LAB — POCT URINALYSIS DIP (DEVICE)
Bilirubin Urine: NEGATIVE
Nitrite: NEGATIVE
Protein, ur: NEGATIVE mg/dL
pH: 6 (ref 5.0–8.0)

## 2013-04-15 MED ORDER — CIPROFLOXACIN HCL 250 MG PO TABS: 250.0000 mg | ORAL_TABLET | Freq: Two times a day (BID) | ORAL | Status: DC

## 2013-04-15 NOTE — ED Provider Notes (Signed)
Medical screening examination/treatment/procedure(s) were performed by resident physician or non-physician practitioner and as supervising physician I was immediately available for consultation/collaboration.   Decker Cogdell DOUGLAS MD.   Neytiri Asche D Cherity Blickenstaff, MD 04/15/13 2003 

## 2013-04-15 NOTE — ED Provider Notes (Signed)
CSN: [id number]     Arrival date & time 04/15/13  1102 History   First MD Initiated Contact with Patient 04/15/13 1116     Chief Complaint  Patient presents with  . Urinary Tract Infection   (Consider location/radiation/quality/duration/timing/severity/associated sxs/prior Treatment) HPI Comments: "I think I have a UTI"  Last night started with sx's   Past Medical History  Diagnosis Date  . ARTHRITIS   . MACULAR DEGENERATION   . Empyema, left 04/2010    related to PNA; s/p VATS/minithoracotomy & bronch  . OA (osteoarthritis)   . OSTEOPOROSIS   . Psoriasis    Past Surgical History  Procedure Laterality Date  . Vats/minithracotomy and bronch  04/2010  . Ganglion cyst (l) thumb    . Mouth surgery     Family History  Problem Relation Age of Onset  . Diabetes Other     Parent  . Hypertension Other     parent  . Dementia Other     Parent  . Lung cancer Other     Parent   History  Substance Use Topics  . Smoking status: Former Smoker    Types: Cigarettes    Quit date: 06/07/1977  . Smokeless tobacco: Not on file     Comment: Divorced, lives alone- Very active at [employer], church. retired from [employer] 2006  . Alcohol Use: No   OB History   Grav Para Term Preterm Abortions TAB SAB Ect Mult Living                 Review of Systems  Constitutional: Negative.   HENT: Negative.   Cardiovascular: Negative.   Gastrointestinal: Negative.   Genitourinary: Positive for dysuria, urgency, frequency and hematuria. Negative for flank pain, decreased urine volume, vaginal bleeding, vaginal discharge, difficulty urinating, vaginal pain, menstrual problem and pelvic pain.    Allergies  Cephalosporins  Home Medications   Current Outpatient Rx  Name  Route  Sig  Dispense  Refill  . Cholecalciferol (VITAMIN D3) 1000 UNITS CAPS   Oral   Take by mouth daily. Take total of 4800 units daily         . EVISTA 60 MG tablet      take 1 tablet by mouth once daily   90  tablet   3   . Lutein-Bilberry (LUTEIN PLUS BILBERRY PO)   Oral   Take by mouth.         . LUTEIN-ZEAXANTHIN PO   Oral   Take by mouth daily.         . Multiple Vitamin (MULTIVITAMIN) tablet   Oral   Take 1 tablet by mouth daily.           . AMBULATORY NON FORMULARY MEDICATION      Bone Strength 700mg  3 tablets daily         . chlorhexidine (PERIDEX) 0.12 % solution   Mouth/Throat   Use as directed in the mouth or throat as needed.          . ciprofloxacin (CIPRO) 250 MG tablet   Oral   Take 1 tablet (250 mg total) by mouth 2 (two) times daily.   10 tablet   0   . cyclobenzaprine (FLEXERIL) 5 MG tablet   Oral   Take 1 tablet (5 mg total) by mouth at bedtime as needed for muscle spasms.   30 tablet   1   . Ibuprofen 200 MG CAPS   Oral   Take 2 capsules (400  mg total) by mouth 3 (three) times daily as needed.   120 each   0   . Lutein 20 MG TABS   Oral   Take by mouth daily.           . predniSONE (STERAPRED UNI-PAK) 10 MG tablet   Oral   Take 1 tablet (10 mg total) by mouth as directed. As directed x 6 days   21 tablet   0   . triamcinolone ointment (KENALOG) 0.1 %   Topical   Apply topically 2 (two) times daily as needed (for affected skin).   30 g   0    BP 150/81  Pulse 90  Temp(Src) 98 F (36.7 C) (Oral)  Resp 18  SpO2 96% Physical Exam  Nursing note and vitals reviewed. Constitutional: She is oriented to person, place, and time. She appears well-developed and well-nourished. No distress.  Neck: Normal range of motion. Neck supple.  Cardiovascular: Normal rate, regular rhythm and normal heart sounds.   Pulmonary/Chest: Effort normal and breath sounds normal. No respiratory distress. She has no wheezes.  Abdominal: Soft. There is no tenderness.  Musculoskeletal:  No CVAT  Neurological: She is alert and oriented to person, place, and time.  Skin: Skin is warm and dry.  Psychiatric: She has a normal mood and affect.    ED  Course  Procedures (including critical care time) Labs Review Labs Reviewed  POCT URINALYSIS DIP (DEVICE) - Abnormal; Notable for the following:    Hgb urine dipstick LARGE (*)    Leukocytes, UA SMALL (*)    All other components within normal limits  URINE CULTURE   Imaging Review No results found.  Results for orders placed during the hospital encounter of 04/15/13  POCT URINALYSIS DIP (DEVICE)      Result Value Range   Glucose, UA NEGATIVE  NEGATIVE mg/dL   Bilirubin Urine NEGATIVE  NEGATIVE   Ketones, ur NEGATIVE  NEGATIVE mg/dL   Specific Gravity, Urine 1.010  1.005 - 1.030   Hgb urine dipstick LARGE (*) NEGATIVE   pH 6.0  5.0 - 8.0   Protein, ur NEGATIVE  NEGATIVE mg/dL   Urobilinogen, UA 0.2  0.0 - 1.0 mg/dL   Nitrite NEGATIVE  NEGATIVE   Leukocytes, UA SMALL (*) NEGATIVE     MDM   1. UTI (lower urinary tract infection)      Drink plenty of fluids cipro 250 bid for 5 d Culture urine F/U your PCP as needed    Hayden Rasmussen, NP 04/15/13 1204

## 2013-04-15 NOTE — ED Notes (Signed)
C/O dysuria, frequent urination, and urinary urgency since yesterday.  Reports one episode of slight hematuria.  Denies any abd or flank pain; denies fevers.

## 2013-04-16 ENCOUNTER — Telehealth: Payer: Self-pay | Admitting: *Deleted

## 2013-04-16 NOTE — Telephone Encounter (Signed)
[facility] Triage Call Report Triage Record Num: [id number] Operator: Patriciaann Clan Patient Name: Terry Robbins Call Date & Time: 04/15/2013 8:05:05AM Patient Phone: [phone] PCP: Rene Paci Patient Gender: Female PCP Fax : [phone] Patient DOB: 06-26-1933 Practice Name: [facility] Reason for Call: Caller: [name]/Patient; PCP: Rene Paci (Adults only); CB#: [phone]; Call regarding Urinary Pain/Bleeding; Patient states she awakened at 0310 04/15/13 with urinary urgency. States burning noted with urination. Patient states she noted blood in urine at [facility] 04/15/13. Denies flank or abdominal pain. Afebrile. Patient taking fluids well. Patient states burning noted of genitalia with passage of urination. Triage per [facility] Protocol. No emergent sx identified. Disposition of " See Provider within 4 hours" obtained related to positive triage assessment for " Urinary tract symptoms and any flank, low back, lower abdominal or genital area pain." Care advice given per guidelines. Patient advised increased fluids. Patient advised to be evaluated in the Urgent Care within 4 hours. Patient advised not to drive self. Patient verbalizes understanding and agreeable. Protocol(s) Used: [facility]y Urine Protocol(s) Used: Urinary Symptoms - Female Recommended Outcome per Protocol: See Provider within 4 hours Reason for Outcome: Blood in urine Urinary tract symptoms AND any flank, low back, lower abdominal or genital area (labia, vagina OR testicle/scrotum) pain Care Advice: ~ Another adult should drive. ~ IMMEDIATE ACTION Systemic Inflammatory Response Syndrome (SIRS): Watch for signs of a generalized, whole body infection. Occurs within days of a localized infection, especially of the urinary, GI, respiratory or nervous systems; or after a traumatic injury or invasive procedure. - Call EMS 911 if symptoms have worsened, such as increasing confusion or unusual drowsiness;  cold and clammy skin; no urine output; rapid respiration (>30/min.) or slow respiration (<10/min.); struggling to breathe. - Go to the ED immediately for early symptoms of rapid pulse >90/min. or rapid breathing >20/min. at rest; chills; oral temperature >100.4 F (38 C) or <96.8 F (36 C) when associated with conditions noted. ~ Total water intake includes drinking water, water in beverages, and water contained in food. Fluids make up about 80% of the body's total hydration need. Individual fluid requirement to maintain hydration vary based on physical activity, environmental factors and illness. Limit fluids that contain sugar, caffeine, or alcohol. Urine will be very light yellow color when you drink enough fluids. ~

## 2013-04-19 ENCOUNTER — Telehealth: Payer: Self-pay | Admitting: *Deleted

## 2013-04-19 DIAGNOSIS — R300 Dysuria: Secondary | ICD-10-CM

## 2013-04-19 NOTE — Telephone Encounter (Signed)
Pt called states she continues to have the urinary urgency while on Cipro.  She is having redness and burning to her hands which she has not had before.  Please advise

## 2013-04-20 ENCOUNTER — Encounter: Payer: Self-pay | Admitting: Internal Medicine

## 2013-04-20 ENCOUNTER — Other Ambulatory Visit (INDEPENDENT_AMBULATORY_CARE_PROVIDER_SITE_OTHER): Payer: 59

## 2013-04-20 ENCOUNTER — Telehealth: Payer: Self-pay | Admitting: *Deleted

## 2013-04-20 DIAGNOSIS — R300 Dysuria: Secondary | ICD-10-CM

## 2013-04-20 LAB — URINALYSIS, ROUTINE W REFLEX MICROSCOPIC
Bilirubin Urine: NEGATIVE
Hgb urine dipstick: NEGATIVE
Ketones, ur: NEGATIVE
Leukocytes, UA: NEGATIVE
Nitrite: NEGATIVE
Specific Gravity, Urine: >=1.03 (ref 1.000–1.030)
Urobilinogen, UA: 0.2 (ref 0.0–1.0)

## 2013-04-20 MED ORDER — SOLIFENACIN SUCCINATE 5 MG PO TABS: 5.0000 mg | ORAL_TABLET | Freq: Every day | ORAL | Status: DC

## 2013-04-20 NOTE — Addendum Note (Signed)
Addended by: Rene Paci A on: 04/20/2013 03:22 PM   Modules accepted: Orders

## 2013-04-20 NOTE — Telephone Encounter (Signed)
A) stop cipro B) come in for UA (lab only) - order in EMR - will contact pt after review if additional tx change needed C) hand symptoms may be related to cipro - let me know if symptoms worse or unimproved with DC of Cipro

## 2013-04-20 NOTE — Telephone Encounter (Signed)
Left message on VM for pt to return call to [facility]. 

## 2013-04-20 NOTE — Telephone Encounter (Signed)
Spoke with pt advised of MDs message.  She states the urinary symptoms have subsided but will come in for the UA.

## 2013-04-25 ENCOUNTER — Other Ambulatory Visit: Payer: Self-pay | Admitting: Internal Medicine

## 2013-04-25 DIAGNOSIS — Z1231 Encounter for screening mammogram for malignant neoplasm of breast: Secondary | ICD-10-CM

## 2013-05-16 ENCOUNTER — Ambulatory Visit (HOSPITAL_COMMUNITY)
Admission: RE | Admit: 2013-05-16 | Discharge: 2013-05-16 | Disposition: A | Discharge status: Home / Self Care | Payer: 59 | Source: Ambulatory Visit | Attending: Internal Medicine | Admitting: Internal Medicine

## 2013-05-16 DIAGNOSIS — Z1231 Encounter for screening mammogram for malignant neoplasm of breast: Secondary | ICD-10-CM | POA: Insufficient documentation

## 2013-05-21 ENCOUNTER — Ambulatory Visit (INDEPENDENT_AMBULATORY_CARE_PROVIDER_SITE_OTHER): Payer: Medicare Other | Admitting: Internal Medicine

## 2013-05-21 ENCOUNTER — Encounter: Payer: Self-pay | Admitting: Internal Medicine

## 2013-05-21 VITALS — BP 124/70 | HR 73 | Temp 97.3°F | Ht 65.0 in | Wt 135.6 lb

## 2013-05-21 DIAGNOSIS — Z0000 Encounter for general adult medical examination without abnormal findings: Secondary | ICD-10-CM

## 2013-05-21 DIAGNOSIS — Z136 Encounter for screening for cardiovascular disorders: Secondary | ICD-10-CM

## 2013-05-21 DIAGNOSIS — Z23 Encounter for immunization: Secondary | ICD-10-CM

## 2013-05-21 NOTE — Progress Notes (Signed)
Pre-visit discussion using our clinic review tool. No additional management support is needed unless otherwise documented below in the visit note.  

## 2013-05-21 NOTE — Progress Notes (Signed)
Subjective:    Patient ID: Terry Robbins, female    DOB: 1934-02-15, 79 y.o.   MRN: [id number]  HPI  patient is here today for annual physical. Patient feels well and has no complaints.  Past Medical History  Diagnosis Date  . ARTHRITIS   . MACULAR DEGENERATION   . Empyema, left 04/2010    related to PNA; s/p VATS/minithoracotomy & bronch  . OA (osteoarthritis)   . OSTEOPOROSIS   . Psoriasis    Family History  Problem Relation Age of Onset  . Diabetes Other     Parent  . Hypertension Other     parent  . Dementia Other     Parent  . Lung cancer Other     Parent   History  Substance Use Topics  . Smoking status: Former Smoker    Types: Cigarettes    Quit date: 06/07/1977  . Smokeless tobacco: Not on file     Comment: Divorced, lives alone- Very active at [employer], church. retired from [employer] 2006  . Alcohol Use: No    Review of Systems  Constitutional: Negative for fatigue and unexpected weight change.  Respiratory: Negative for cough, shortness of breath and wheezing.   Cardiovascular: Negative for chest pain, palpitations and leg swelling.  Gastrointestinal: Negative for nausea, abdominal pain and diarrhea.  Neurological: Negative for dizziness, weakness, light-headedness and headaches.  Psychiatric/Behavioral: Negative for dysphoric mood. The patient is not nervous/anxious.   All other systems reviewed and are negative.       Objective:   Physical Exam BP 124/70  Pulse 73  Temp(Src) 97.3 F (36.3 C) (Oral)  Ht 5\' 5"  (1.651 m)  Wt 135 lb 9.6 oz (61.508 kg)  BMI 22.57 kg/m2  SpO2 95% Wt Readings from Last 3 Encounters:  05/21/13 135 lb 9.6 oz (61.508 kg)  03/26/13 134 lb (60.782 kg)  01/23/13 134 lb 1.9 oz (60.836 kg)   Constitutional: She appears well-developed and well-nourished. No distress.  HENT: Head: Normocephalic and atraumatic. Ears: wears hearing aids bilaterally. When removed, B TMs ok, no erythema or effusion; Nose: Nose normal.  Mouth/Throat: Oropharynx is clear and moist. No oropharyngeal exudate.  Eyes: Conjunctivae and EOM are normal. Pupils are equal, round, and reactive to light. No scleral icterus.  Neck: Normal range of motion. Neck supple. No JVD present. No thyromegaly present.  Cardiovascular: Normal rate, regular rhythm and normal heart sounds.  No murmur heard. No BLE edema. Pulmonary/Chest: Effort normal and breath sounds normal. No respiratory distress. She has no wheezes.  Abdominal: Soft. Bowel sounds are normal. She exhibits no distension. There is no tenderness. no masses Musculoskeletal: Normal range of motion, no joint effusions. No gross deformities Neurological: She is alert and oriented to person, place, and time. No cranial nerve deficit. Coordination, balance, strength, speech and gait are normal.  Skin: left tunnel syndrome, right great toenail severely affected by onychomycosis but remains attached at the lateral edges and base. No inflammation or erythema. Skin is warm and dry. No rash noted. No erythema.  Psychiatric: She has a normal mood and affect. Her behavior is normal. Judgment and thought content normal.   Lab Results  Component Value Date   WBC 6.2 03/13/2012   HGB 14.4 03/13/2012   HCT 42.6 03/13/2012   PLT 235.0 03/13/2012   GLUCOSE 90 03/13/2012   CHOL 199 03/13/2012   TRIG 81.0 03/13/2012   HDL 86.30 03/13/2012   LDLCALC 97 03/13/2012   ALT 14 03/13/2012  AST 16 03/13/2012   NA 142 03/13/2012   K 4.2 03/13/2012   CL 105 03/13/2012   CREATININE 0.8 03/13/2012   BUN 22 03/13/2012   CO2 31 03/13/2012   TSH 1.61 03/13/2012   INR 1.06 05/29/2010   HGBA1C 5.6 03/13/2012   ECG: sinus @ 72 bpm - no ischemic change or arrhythmia      Assessment & Plan:   CPX/v70.0 - Patient has been counseled on age-appropriate routine health concerns for screening and prevention. These are reviewed and up-to-date. Immunizations are up-to-date or declined. Labs ordered and ECG reviewed.

## 2013-05-21 NOTE — Patient Instructions (Addendum)
It was good to see you today.  We have reviewed your prior records including labs and tests today  Health Maintenance reviewed - all recommended immunizations and age-appropriate screenings are up-to-date. Prevnar (pneumonia) vaccination updated today  Medications reviewed and updated, no changes recommended at this time.  Test(s) ordered today. Return when you're fasting for these labs. Your results will be released to MyChart (or called to you) after review, usually within 72hours after test completion. If any changes need to be made, you will be notified at that same time.  Please schedule followup in 12 months for annual visit and labs, but please call sooner if problems.  Health Maintenance, Female A healthy lifestyle and preventative care can promote health and wellness.  Maintain regular health, dental, and eye exams.  Eat a healthy diet. Foods like vegetables, fruits, whole grains, low-fat dairy products, and lean protein foods contain the nutrients you need without too many calories. Decrease your intake of foods high in solid fats, added sugars, and salt. Get information about a proper diet from your caregiver, if necessary.  Regular physical exercise is one of the most important things you can do for your health. Most adults should get at least 150 minutes of moderate-intensity exercise (any activity that increases your heart rate and causes you to sweat) each week. In addition, most adults need muscle-strengthening exercises on 2 or more days a week.   Maintain a healthy weight. The body mass index (BMI) is a screening tool to identify possible weight problems. It provides an estimate of body fat based on height and weight. Your caregiver can help determine your BMI, and can help you achieve or maintain a healthy weight. For adults 20 years and older:  A BMI below 18.5 is considered underweight.  A BMI of 18.5 to 24.9 is normal.  A BMI of 25 to 29.9 is considered  overweight.  A BMI of 30 and above is considered obese.  Maintain normal blood lipids and cholesterol by exercising and minimizing your intake of saturated fat. Eat a balanced diet with plenty of fruits and vegetables. Blood tests for lipids and cholesterol should begin at age 70 and be repeated every 5 years. If your lipid or cholesterol levels are high, you are over 50, or you are a high risk for heart disease, you may need your cholesterol levels checked more frequently.Ongoing high lipid and cholesterol levels should be treated with medicines if diet and exercise are not effective.  If you smoke, find out from your caregiver how to quit. If you do not use tobacco, do not start.  Lung cancer screening is recommended for adults aged 53 80 years who are at high risk for developing lung cancer because of a history of smoking. Yearly low-dose computed tomography (CT) is recommended for people who have at least a 30-pack-year history of smoking and are a current smoker or have quit within the past 15 years. A pack year of smoking is smoking an average of 1 pack of cigarettes a day for 1 year (for example: 1 pack a day for 30 years or 2 packs a day for 15 years). Yearly screening should continue until the smoker has stopped smoking for at least 15 years. Yearly screening should also be stopped for people who develop a health problem that would prevent them from having lung cancer treatment.  If you are pregnant, do not drink alcohol. If you are breastfeeding, be very cautious about drinking alcohol. If you are not pregnant  and choose to drink alcohol, do not exceed 1 drink per day. One drink is considered to be 12 ounces (355 mL) of beer, 5 ounces (148 mL) of wine, or 1.5 ounces (44 mL) of liquor.  Avoid use of street drugs. Do not share needles with anyone. Ask for help if you need support or instructions about stopping the use of drugs.  High blood pressure causes heart disease and increases the risk  of stroke. Blood pressure should be checked at least every 1 to 2 years. Ongoing high blood pressure should be treated with medicines, if weight loss and exercise are not effective.  If you are 36 to 77 years old, ask your caregiver if you should take aspirin to prevent strokes.  Diabetes screening involves taking a blood sample to check your fasting blood sugar level. This should be done once every 3 years, after age 77, if you are within normal weight and without risk factors for diabetes. Testing should be considered at a younger age or be carried out more frequently if you are overweight and have at least 1 risk factor for diabetes.  Breast cancer screening is essential preventative care for women. You should practice "breast self-awareness." This means understanding the normal appearance and feel of your breasts and may include breast self-examination. Any changes detected, no matter how small, should be reported to a caregiver. Women in their 54s and 30s should have a clinical breast exam (CBE) by a caregiver as part of a regular health exam every 1 to 3 years. After age 23, women should have a CBE every year. Starting at age 65, women should consider having a mammogram (breast X-ray) every year. Women who have a family history of breast cancer should talk to their caregiver about genetic screening. Women at a high risk of breast cancer should talk to their caregiver about having an MRI and a mammogram every year.  Breast cancer gene (BRCA)-related cancer risk assessment is recommended for women who have family members with BRCA-related cancers. BRCA-related cancers include breast, ovarian, tubal, and peritoneal cancers. Having family members with these cancers may be associated with an increased risk for harmful changes (mutations) in the breast cancer genes BRCA1 and BRCA2. Results of the assessment will determine the need for genetic counseling and BRCA1 and BRCA2 testing.  The Pap test is a  screening test for cervical cancer. Women should have a Pap test starting at age 59. Between ages 21 and 71, Pap tests should be repeated every 2 years. Beginning at age 93, you should have a Pap test every 3 years as long as the past 3 Pap tests have been normal. If you had a hysterectomy for a problem that was not cancer or a condition that could lead to cancer, then you no longer need Pap tests. If you are between ages 65 and 33, and you have had normal Pap tests going back 10 years, you no longer need Pap tests. If you have had past treatment for cervical cancer or a condition that could lead to cancer, you need Pap tests and screening for cancer for at least 20 years after your treatment. If Pap tests have been discontinued, risk factors (such as a new sexual partner) need to be reassessed to determine if screening should be resumed. Some women have medical problems that increase the chance of getting cervical cancer. In these cases, your caregiver may recommend more frequent screening and Pap tests.  The human papillomavirus (HPV) test is an additional  test that may be used for cervical cancer screening. The HPV test looks for the virus that can cause the cell changes on the cervix. The cells collected during the Pap test can be tested for HPV. The HPV test could be used to screen women aged 73 years and older, and should be used in women of any age who have unclear Pap test results. After the age of 42, women should have HPV testing at the same frequency as a Pap test.  Colorectal cancer can be detected and often prevented. Most routine colorectal cancer screening begins at the age of 24 and continues through age 42. However, your caregiver may recommend screening at an earlier age if you have risk factors for colon cancer. On a yearly basis, your caregiver may provide home test kits to check for hidden blood in the stool. Use of a small camera at the end of a tube, to directly examine the colon  (sigmoidoscopy or colonoscopy), can detect the earliest forms of colorectal cancer. Talk to your caregiver about this at age 63, when routine screening begins. Direct examination of the colon should be repeated every 5 to 10 years through age 81, unless early forms of pre-cancerous polyps or small growths are found.  Hepatitis C blood testing is recommended for all people born from [birth year] through 1963 and any individual with known risks for hepatitis C.  Practice safe sex. Use condoms and avoid high-risk sexual practices to reduce the spread of sexually transmitted infections (STIs). Sexually active women aged 77 and younger should be checked for Chlamydia, which is a common sexually transmitted infection. Older women with new or multiple partners should also be tested for Chlamydia. Testing for other STIs is recommended if you are sexually active and at increased risk.  Osteoporosis is a disease in which the bones lose minerals and strength with aging. This can result in serious bone fractures. The risk of osteoporosis can be identified using a bone density scan. Women ages 33 and over and women at risk for fractures or osteoporosis should discuss screening with their caregivers. Ask your caregiver whether you should be taking a calcium supplement or vitamin D to reduce the rate of osteoporosis.  Menopause can be associated with physical symptoms and risks. Hormone replacement therapy is available to decrease symptoms and risks. You should talk to your caregiver about whether hormone replacement therapy is right for you.  Use sunscreen. Apply sunscreen liberally and repeatedly throughout the day. You should seek shade when your shadow is shorter than you. Protect yourself by wearing long sleeves, pants, a wide-brimmed hat, and sunglasses year round, whenever you are outdoors.  Notify your caregiver of new moles or changes in moles, especially if there is a change in shape or color. Also notify your  caregiver if a mole is larger than the size of a pencil eraser.  Stay current with your immunizations. Document Released: 12/07/2010 Document Revised: 09/18/2012 Document Reviewed: 12/07/2010 [facility] Patient Information 2014 [place], [place].

## 2013-05-24 ENCOUNTER — Telehealth: Payer: Self-pay | Admitting: *Deleted

## 2013-05-24 ENCOUNTER — Encounter: Payer: Self-pay | Admitting: Internal Medicine

## 2013-05-24 ENCOUNTER — Other Ambulatory Visit (INDEPENDENT_AMBULATORY_CARE_PROVIDER_SITE_OTHER): Payer: 59

## 2013-05-24 DIAGNOSIS — M810 Age-related osteoporosis without current pathological fracture: Secondary | ICD-10-CM

## 2013-05-24 DIAGNOSIS — Z136 Encounter for screening for cardiovascular disorders: Secondary | ICD-10-CM

## 2013-05-24 DIAGNOSIS — Z0000 Encounter for general adult medical examination without abnormal findings: Secondary | ICD-10-CM

## 2013-05-24 LAB — LIPID PANEL
Cholesterol: 184 mg/dL (ref 0–200)
HDL: 78.6 mg/dL (ref >39.00)
LDL Cholesterol: 90 mg/dL (ref 0–99)
Triglycerides: 77 mg/dL (ref 0.0–149.0)
VLDL: 15.4 mg/dL (ref 0.0–40.0)

## 2013-05-24 LAB — URINALYSIS, ROUTINE W REFLEX MICROSCOPIC
Bilirubin Urine: NEGATIVE
Nitrite: NEGATIVE
Specific Gravity, Urine: 1.01 (ref 1.000–1.030)
Total Protein, Urine: NEGATIVE
pH: 7 (ref 5.0–8.0)

## 2013-05-24 LAB — BASIC METABOLIC PANEL
BUN: 21 mg/dL (ref 6–23)
CO2: 27 mEq/L (ref 19–32)
GFR: 73.8 mL/min (ref >60.00)
Potassium: 3.8 mEq/L (ref 3.5–5.1)
Sodium: 141 mEq/L (ref 135–145)

## 2013-05-24 LAB — HEPATIC FUNCTION PANEL
ALT: 13 U/L (ref 0–35)
Albumin: 3.9 g/dL (ref 3.5–5.2)
Alkaline Phosphatase: 58 U/L (ref 39–117)
Bilirubin, Direct: 0.1 mg/dL (ref 0.0–0.3)
Total Protein: 6.2 g/dL (ref 6.0–8.3)

## 2013-05-24 LAB — CBC WITH DIFFERENTIAL/PLATELET
Basophils Absolute: 0 10*3/uL (ref 0.0–0.1)
Basophils Relative: 0.2 % (ref 0.0–3.0)
Eosinophils Relative: 4.5 % (ref 0.0–5.0)
HCT: 40.8 % (ref 36.0–46.0)
Lymphs Abs: 1.5 10*3/uL (ref 0.7–4.0)
MCV: 84.1 fl (ref 78.0–100.0)
Monocytes Relative: 8 % (ref 3.0–12.0)
Platelets: 273 10*3/uL (ref 150.0–400.0)
RBC: 4.85 Mil/uL (ref 3.87–5.11)
WBC: 7.1 10*3/uL (ref 4.5–10.5)

## 2013-05-24 LAB — TSH: TSH: 1.48 u[IU]/mL (ref 0.35–5.50)

## 2013-05-24 NOTE — Telephone Encounter (Signed)
Pt is in the lab having cpx labs done. She also stated md was going to check vitamin D. Per Dr. Felicity Coyer ok to enter...Terry Robbins

## 2013-05-25 ENCOUNTER — Encounter: Payer: Self-pay | Admitting: Internal Medicine

## 2013-05-25 LAB — VITAMIN D 25 HYDROXY (VIT D DEFICIENCY, FRACTURES): Vit D, 25-Hydroxy: 83 ng/mL (ref 30–89)

## 2013-07-20 ENCOUNTER — Telehealth: Payer: Self-pay | Admitting: *Deleted

## 2013-07-20 NOTE — Telephone Encounter (Signed)
Notified pt md had release her results to mychart. Labs was normal inform pt she can see values on mychart...Raechel Chute/lmb

## 2013-07-20 NOTE — Telephone Encounter (Signed)
Patient phoned requesting results from 05/24/13 labs.  Please advise.  CB# [phone]

## 2013-07-23 ENCOUNTER — Telehealth: Payer: Self-pay | Admitting: *Deleted

## 2013-07-23 NOTE — Telephone Encounter (Signed)
Patient phoned requesting lab results from 05/2013-states she went on mychart and couldn't find them.  Her email address is [email or web address]  Please advise  CB# [phone]

## 2013-07-23 NOTE — Telephone Encounter (Signed)
All labs are ok - They have been released to MyChart  Please print and mail a copy if pt is unable to see these - also have pt contact the help line to review her problems with My Chart: [phone] ([id number])

## 2013-07-25 NOTE — Telephone Encounter (Signed)
Phoned and notified patient of MD response and recommendation.  [facility] helpline number provided.

## 2013-07-26 ENCOUNTER — Other Ambulatory Visit: Payer: Self-pay | Admitting: Internal Medicine

## 2013-08-13 ENCOUNTER — Other Ambulatory Visit: Payer: Self-pay | Admitting: Internal Medicine

## 2013-09-21 ENCOUNTER — Encounter: Payer: Self-pay | Admitting: Internal Medicine

## 2013-09-21 ENCOUNTER — Ambulatory Visit (INDEPENDENT_AMBULATORY_CARE_PROVIDER_SITE_OTHER): Payer: 59 | Admitting: Internal Medicine

## 2013-09-21 VITALS — BP 112/72 | HR 76 | Temp 97.0°F | Wt 131.4 lb

## 2013-09-21 DIAGNOSIS — J3089 Other allergic rhinitis: Secondary | ICD-10-CM

## 2013-09-21 DIAGNOSIS — M810 Age-related osteoporosis without current pathological fracture: Secondary | ICD-10-CM

## 2013-09-21 MED ORDER — LORATADINE 10 MG PO TABS: 10.0000 mg | ORAL_TABLET | Freq: Every day | ORAL | Status: DC

## 2013-09-21 MED ORDER — FLUTICASONE PROPIONATE 50 MCG/ACT NA SUSP: 1.0000 | Freq: Every day | NASAL | Status: DC

## 2013-09-21 NOTE — Assessment & Plan Note (Signed)
On Evista therapy and takes vitamin D plus calcium for same DEXA 12/2007 and 03/29/12: -1.5 No history fracture, no bone pain, but reviewed dental implant problems over 2014 ad early 2015. Active with weightbearing activity Check Vit D next labs

## 2013-09-21 NOTE — Assessment & Plan Note (Signed)
PND likely contributing to mucus in throat sensation -  Begin loratadine and add flonase - erx done

## 2013-09-21 NOTE — Progress Notes (Signed)
Subjective:    Patient ID: Terry Robbins, female    DOB: 06/23/1933, 80 y.o.   MRN: [id number]  HPI  Patient is here for ?allergies vs chest cold Also reviewed chronic medical issues and interval medical events  Past Medical History  Diagnosis Date  . ARTHRITIS   . MACULAR DEGENERATION   . Empyema, left 04/2010    related to PNA; s/p VATS/minithoracotomy & bronch  . OA (osteoarthritis)   . OSTEOPOROSIS   . Psoriasis     Review of Systems  Constitutional: Negative for fever, fatigue and unexpected weight change.  HENT: Positive for postnasal drip, rhinorrhea and sneezing. Negative for nosebleeds and sinus pressure.   Respiratory: Positive for cough (in AM). Negative for chest tightness, shortness of breath and wheezing.   Allergic/Immunologic: Positive for environmental allergies.       Objective:   Physical Exam  BP 112/72  Pulse 76  Temp(Src) 97 F (36.1 C) (Oral)  Wt 131 lb 6.4 oz (59.603 kg)  SpO2 97% Wt Readings from Last 3 Encounters:  09/21/13 131 lb 6.4 oz (59.603 kg)  05/21/13 135 lb 9.6 oz (61.508 kg)  03/26/13 134 lb (60.782 kg)   Constitutional: She appears well-developed and well-nourished. No distress.  HENT: NCAT, sinus nontender - Ears hazy TMs without effusion or erythema - B aides - OP with PND and cobblestoning - nares with mild clear DC, no turbinate swelling Neck: Normal range of motion. Neck supple. No JVD present. No thyromegaly present.  Cardiovascular: Normal rate, regular rhythm and normal heart sounds.  No murmur heard. No BLE edema. Pulmonary/Chest: Effort normal and breath sounds normal. No respiratory distress. She has no wheezes.  Psychiatric: She has a normal mood and affect. Her behavior is normal. Judgment and thought content normal.   Lab Results  Component Value Date   WBC 7.1 05/24/2013   HGB 13.6 05/24/2013   HCT 40.8 05/24/2013   PLT 273.0 05/24/2013   GLUCOSE 101* 05/24/2013   CHOL 184 05/24/2013   TRIG 77.0 05/24/2013    HDL 78.60 05/24/2013   LDLCALC 90 05/24/2013   ALT 13 05/24/2013   AST 16 05/24/2013   NA 141 05/24/2013   K 3.8 05/24/2013   CL 107 05/24/2013   CREATININE 0.8 05/24/2013   BUN 21 05/24/2013   CO2 27 05/24/2013   TSH 1.48 05/24/2013   INR 1.06 05/29/2010   HGBA1C 5.6 03/13/2012    Mm Digital Screening 3d Tomo  05/16/2013   CLINICAL DATA:  Screening.  EXAM: DIGITAL SCREENING BILATERAL MAMMOGRAM WITH 3D TOMO WITH CAD  DIGITAL BREAST TOMOSYNTHESIS  Digital breast tomosynthesis images are acquired in two projections. These images are reviewed in combination with the digital mammogram, confirming the findings below.  COMPARISON:  Previous exam(s).  ACR Breast Density Category b: There are scattered areas of fibroglandular density.  FINDINGS: There are no findings suspicious for malignancy. Images were processed with CAD.  IMPRESSION: No mammographic evidence of malignancy. A result letter of this screening mammogram will be mailed directly to the patient.  RECOMMENDATION: Screening mammogram in one year. (Code:SM-B-01Y)  BI-RADS CATEGORY  1: Negative   Electronically Signed   By: Esperanza Heiraymond  Rubner M.D.   On: 05/16/2013 17:14       Assessment & Plan:   Problem List Items Addressed This Visit   Allergic rhinitis due to other allergen - Primary     PND likely contributing to mucus in throat sensation -  Begin loratadine and add flonase -  erx done    OSTEOPOROSIS     On Evista therapy and takes vitamin D plus calcium for same DEXA 12/2007 and 03/29/12: -1.5 No history fracture, no bone pain, but reviewed dental implant problems over 2014 ad early 2015. Active with weightbearing activity Check Vit D next labs

## 2013-09-21 NOTE — Patient Instructions (Addendum)
It was good to see you today.  We have reviewed your prior records including labs and tests today  Medications reviewed and updated Again generic Claritin once daily instead of 25 mg allergy relief  Also start Flonase spray once daily for the next 30 days No other changes recommended at this time.  Your prescription(s) have been submitted to your pharmacy. Please take as directed and contact our office if you believe you are having problem(s) with the medication(s).  Please keep scheduled followup as planned, please call sooner if symptoms worse or unimproved  Hay Fever Hay fever is an allergic reaction to particles in the air. It cannot be passed from person to person. It cannot be cured, but it can be controlled. CAUSES  Hay fever is caused by something that triggers an allergic reaction (allergens). The following are examples of allergens:  Ragweed.  Feathers.  Animal dander.  Grass and tree pollens.  Cigarette smoke.  House dust.  Pollution. SYMPTOMS   Sneezing.  Runny or stuffy nose.  Tearing eyes.  Itchy eyes, nose, mouth, throat, skin, or other area.  Sore throat.  Headache.  Decreased sense of smell or taste. DIAGNOSIS Your caregiver will perform a physical exam and ask questions about the symptoms you are having.Allergy testing may be done to determine exactly what triggers your hay fever.  TREATMENT   Over-the-counter medicines may help symptoms. These include:  Antihistamines.  Decongestants. These may help with nasal congestion.  Your caregiver may prescribe medicines if over-the-counter medicines do not work.  Some people benefit from allergy shots when other medicines are not helpful. HOME CARE INSTRUCTIONS   Avoid the allergen that is causing your symptoms, if possible.  Take all medicine as told by your caregiver. SEEK MEDICAL CARE IF:   You have severe allergy symptoms and your current medicines are not helping.  Your treatment  was working at one time, but you are now experiencing symptoms.  You have sinus congestion and pressure.  You develop a fever or headache.  You have thick nasal discharge.  You have asthma and have a worsening cough and wheezing. SEEK IMMEDIATE MEDICAL CARE IF:   You have swelling of your tongue or lips.  You have trouble breathing.  You feel lightheaded or like you are going to faint.  You have cold sweats.  You have a fever. Document Released: 05/24/2005 Document Revised: 08/16/2011 Document Reviewed: 08/19/2010 [facility] Patient Information 2014 [place], [place].

## 2013-09-21 NOTE — Progress Notes (Signed)
Pre visit review using our clinic review tool, if applicable. No additional management support is needed unless otherwise documented below in the visit note. 

## 2014-01-22 ENCOUNTER — Encounter: Payer: Self-pay | Admitting: Nurse Practitioner

## 2014-01-22 ENCOUNTER — Ambulatory Visit (INDEPENDENT_AMBULATORY_CARE_PROVIDER_SITE_OTHER): Payer: Medicare Other | Admitting: Nurse Practitioner

## 2014-01-22 VITALS — BP 130/80 | HR 76 | Temp 97.5°F | Ht 65.0 in | Wt 131.1 lb

## 2014-01-22 DIAGNOSIS — R109 Unspecified abdominal pain: Secondary | ICD-10-CM

## 2014-01-22 DIAGNOSIS — R197 Diarrhea, unspecified: Secondary | ICD-10-CM

## 2014-01-22 DIAGNOSIS — K5730 Diverticulosis of large intestine without perforation or abscess without bleeding: Secondary | ICD-10-CM | POA: Insufficient documentation

## 2014-01-22 DIAGNOSIS — R630 Anorexia: Secondary | ICD-10-CM

## 2014-01-22 NOTE — Patient Instructions (Addendum)
Patient to call clinic if develops lower abdominal pain, fever, chills, nausea, vomiting. Follow BRAT diet  Bland diet over next few days then introduce slowly regular diet Call clinic with questions or concerns or if symptoms not resolved,  Or worsen. Keep follow up appointment with Dr. Felicity Coyer as scheduled.       Food Choices to Help Relieve Diarrhea When you have diarrhea, the foods you eat and your eating habits are very important. Choosing the right foods and drinks can help relieve diarrhea. Also, because diarrhea can last up to 7 days, you need to replace lost fluids and electrolytes (such as sodium, potassium, and chloride) in order to help prevent dehydration.  WHAT GENERAL GUIDELINES DO I NEED TO FOLLOW?  Slowly drink 1 cup (8 oz) of fluid for each episode of diarrhea. If you are getting enough fluid, your urine will be clear or pale yellow.  Eat starchy foods. Some good choices include white rice, white toast, pasta, low-fiber cereal, baked potatoes (without the skin), saltine crackers, and bagels.  Avoid large servings of any cooked vegetables.  Limit fruit to two servings per day. A serving is  cup or 1 small piece.  Choose foods with less than 2 g of fiber per serving.  Limit fats to less than 8 tsp (38 g) per day.  Avoid fried foods.  Eat foods that have probiotics in them. Probiotics can be found in certain dairy products.  Avoid foods and beverages that may increase the speed at which food moves through the stomach and intestines (gastrointestinal tract). Things to avoid include:  High-fiber foods, such as dried fruit, raw fruits and vegetables, nuts, seeds, and whole grain foods.  Spicy foods and high-fat foods.  Foods and beverages sweetened with high-fructose corn syrup, honey, or sugar alcohols such as xylitol, sorbitol, and mannitol. WHAT FOODS ARE RECOMMENDED? Grains White rice. White, [place], or pita breads (fresh or toasted), including plain rolls,  buns, or bagels. White pasta. Saltine, soda, or graham crackers. Pretzels. Low-fiber cereal. Cooked cereals made with water (such as cornmeal, farina, or cream cereals). Plain muffins. Matzo. Melba toast. Zwieback.  Vegetables Potatoes (without the skin). Strained tomato and vegetable juices. Most well-cooked and canned vegetables without seeds. Tender lettuce. Fruits Cooked or canned applesauce, apricots, cherries, fruit cocktail, grapefruit, peaches, pears, or plums. Fresh bananas, apples without skin, cherries, grapes, cantaloupe, grapefruit, peaches, oranges, or plums.  Meat and Other Protein Products Baked or boiled chicken. Eggs. Tofu. Fish. Seafood. Smooth peanut butter. Ground or well-cooked tender beef, ham, veal, lamb, pork, or poultry.  Dairy Plain yogurt, kefir, and unsweetened liquid yogurt. Lactose-free milk, buttermilk, or soy milk. Plain hard cheese. Beverages Sport drinks. Clear broths. Diluted fruit juices (except prune). Regular, caffeine-free sodas such as ginger ale. Water. Decaffeinated teas. Oral rehydration solutions. Sugar-free beverages not sweetened with sugar alcohols. Other Bouillon, broth, or soups made from recommended foods.  The items listed above may not be a complete list of recommended foods or beverages. Contact your dietitian for more options. WHAT FOODS ARE NOT RECOMMENDED? Grains Whole grain, whole wheat, bran, or rye breads, rolls, pastas, crackers, and cereals. Wild or brown rice. Cereals that contain more than 2 g of fiber per serving. Corn tortillas or taco shells. Cooked or dry oatmeal. Granola. Popcorn. Vegetables Raw vegetables. Cabbage, broccoli, Brussels sprouts, artichokes, baked beans, beet greens, corn, kale, legumes, peas, sweet potatoes, and yams. Potato skins. Cooked spinach and cabbage. Fruits Dried fruit, including raisins and dates. Raw fruits. Stewed or  dried prunes. Fresh apples with skin, apricots, mangoes, pears, raspberries, and  strawberries.  Meat and Other Protein Products Chunky peanut butter. Nuts and seeds. Beans and lentils. Tomasa BlaseBacon.  Dairy High-fat cheeses. Milk, chocolate milk, and beverages made with milk, such as milk shakes. Cream. Ice cream. Sweets and Desserts Sweet rolls, doughnuts, and sweet breads. Pancakes and waffles. Fats and Oils Butter. Cream sauces. Margarine. Salad oils. Plain salad dressings. Olives. Avocados.  Beverages Caffeinated beverages (such as coffee, tea, soda, or energy drinks). Alcoholic beverages. Fruit juices with pulp. Prune juice. Soft drinks sweetened with high-fructose corn syrup or sugar alcohols. Other Coconut. Hot sauce. Chili powder. Mayonnaise. Gravy. Cream-based or milk-based soups.  The items listed above may not be a complete list of foods and beverages to avoid. Contact your dietitian for more information. WHAT SHOULD I DO IF I BECOME DEHYDRATED? Diarrhea can sometimes lead to dehydration. Signs of dehydration include dark urine and dry mouth and skin. If you think you are dehydrated, you should rehydrate with an oral rehydration solution. These solutions can be purchased at pharmacies, retail stores, or online.  Drink -1 cup (120-240 mL) of oral rehydration solution each time you have an episode of diarrhea. If drinking this amount makes your diarrhea worse, try drinking smaller amounts more often. For example, drink 1-3 tsp (5-15 mL) every 5-10 minutes.  A general rule for staying hydrated is to drink 1-2 L of fluid per day. Talk to your health care provider about the specific amount you should be drinking each day. Drink enough fluids to keep your urine clear or pale yellow. Document Released: 08/14/2003 Document Revised: 05/29/2013 Document Reviewed: 04/16/2013 [facility] Patient Information 2015 [place], [place]. This information is not intended to replace advice given to you by your health care provider. Make sure you discuss any questions you have with your health care  provider.

## 2014-01-22 NOTE — Progress Notes (Signed)
Subjective:    Patient ID: Terry Robbins, female    DOB: Aug 19, 1933, 80 y.o.   MRN: [id number]  HPI  Patient is seen for complaints lower abdominal pains intermittent over past 5 days.  Patient reports eating foods not ususal to her diet, spicey, raw vegetables and now has had loose stools 1-2 times daily with associated flatulents intemittently.   Denies pain with defecation, no diarrhea, nausea, vomiting, fever, chills or dark tarry stools.  Has noticed less of appetite today.       Review of Systems  Constitutional: Negative for fever, chills, appetite change and fatigue.  HENT: Positive for dental problem. Negative for mouth sores and postnasal drip.        Uses peridex  Intermittently for dental care  Respiratory: Negative.  Negative for cough, choking and shortness of breath.   Cardiovascular: Negative.  Negative for chest pain and palpitations.  Gastrointestinal: Positive for abdominal pain. Negative for nausea, vomiting, diarrhea, constipation, blood in stool and rectal pain.       Lower abdominal  intermittent  Genitourinary: Negative.  Negative for dysuria, flank pain and difficulty urinating.  Psychiatric/Behavioral: Negative.      Past Medical History  Diagnosis Date  . ARTHRITIS   . MACULAR DEGENERATION   . Empyema, left 04/2010    related to PNA; s/p VATS/minithoracotomy & bronch  . OA (osteoarthritis)   . OSTEOPOROSIS   . Psoriasis     History   Social History  . Marital Status: Single    Spouse Name: N/A    Number of Children: N/A  . Years of Education: N/A   Occupational History  . Not on file.   Social History Main Topics  . Smoking status: Former Smoker    Types: Cigarettes    Quit date: 06/07/1977  . Smokeless tobacco: Not on file     Comment: Divorced, lives alone- Very active at [employer], church. retired from [employer] 2006  . Alcohol Use: No  . Drug Use: No  . Sexual Activity: Not on file   Other Topics Concern  . Not on file   Social  History Narrative  . No narrative on file    Past Surgical History  Procedure Laterality Date  . Vats/minithracotomy and bronch  04/2010  . Ganglion cyst (l) thumb    . Mouth surgery      Family History  Problem Relation Age of Onset  . Diabetes Other     Parent  . Hypertension Other     parent  . Dementia Other     Parent  . Lung cancer Other     Parent    Allergies  Allergen Reactions  . Cephalosporins Rash    Current Outpatient Prescriptions on File Prior to Visit  Medication Sig Dispense Refill  . chlorhexidine (PERIDEX) 0.12 % solution Use as directed in the mouth or throat as needed.       Marland Kitchen EVISTA 60 MG tablet take 1 tablet by mouth once daily  90 tablet  3  . Lutein-Bilberry (LUTEIN PLUS BILBERRY PO) Take 20 mg by mouth daily.       . LUTEIN-ZEAXANTHIN PO Take by mouth daily.      . Multiple Vitamin (MULTIVITAMIN) tablet Take 1 tablet by mouth daily.         No current facility-administered medications on file prior to visit.    BP 130/80  Pulse 76  Temp(Src) 97.5 F (36.4 C) (Oral)  Ht 5\' 5"  (1.651  m)  Wt 131 lb 1.9 oz (59.476 kg)  BMI 21.82 kg/m2  SpO2 97%       Objective:   Physical Exam  Constitutional: She is oriented to person, place, and time. She appears well-developed and well-nourished. No distress.  HENT:  Head: Normocephalic.  Mouth/Throat: Oropharynx is clear and moist.  Neck: Neck supple. No thyromegaly present.  Cardiovascular: Normal rate and regular rhythm.   Pulmonary/Chest: Effort normal and breath sounds normal. She has no wheezes. She has no rales.  Abdominal: Soft. Bowel sounds are normal. She exhibits no distension and no mass. There is no tenderness. There is no rebound and no guarding.  No tenderness on palpation  Lymphadenopathy:    She has no cervical adenopathy.  Neurological: She is alert and oriented to person, place, and time.  Skin: Skin is warm and dry.  Psychiatric: She has a normal mood and affect.           Assessment & Plan:  1. Abdominal pain, other specified site 2. Diarrhea 3.  Change in appetite Adequate fluid intake. Rest. Patient instructions given for BRAT diet.  Patient will maintain bland diet for 2-3 days and reintroduce regular diet as tolerated.  Patient instructed to call clinic if she develops fever, chills, nausea, vomiting, change in symptoms that become worse, or not resolving.  Monitor intake and appeitte.  Call clinic with questions or concerns Follow up with Primary Care Physician as scheduled.

## 2014-01-22 NOTE — Progress Notes (Signed)
Pre visit review using our clinic review tool, if applicable. No additional management support is needed unless otherwise documented below in the visit note. 

## 2014-03-18 ENCOUNTER — Encounter: Payer: Self-pay | Admitting: Internal Medicine

## 2014-04-10 ENCOUNTER — Other Ambulatory Visit (INDEPENDENT_AMBULATORY_CARE_PROVIDER_SITE_OTHER): Payer: Medicare Other

## 2014-04-10 ENCOUNTER — Encounter: Payer: Self-pay | Admitting: Internal Medicine

## 2014-04-10 ENCOUNTER — Ambulatory Visit (INDEPENDENT_AMBULATORY_CARE_PROVIDER_SITE_OTHER): Payer: Medicare Other | Admitting: Internal Medicine

## 2014-04-10 VITALS — BP 124/64 | HR 68 | Temp 97.6°F | Ht 65.0 in | Wt 132.0 lb

## 2014-04-10 DIAGNOSIS — Z0000 Encounter for general adult medical examination without abnormal findings: Secondary | ICD-10-CM

## 2014-04-10 DIAGNOSIS — Z79899 Other long term (current) drug therapy: Secondary | ICD-10-CM

## 2014-04-10 DIAGNOSIS — Z23 Encounter for immunization: Secondary | ICD-10-CM

## 2014-04-10 DIAGNOSIS — M810 Age-related osteoporosis without current pathological fracture: Secondary | ICD-10-CM

## 2014-04-10 LAB — BASIC METABOLIC PANEL
BUN: 22 mg/dL (ref 6–23)
CHLORIDE: 105 meq/L (ref 96–112)
CO2: 31 meq/L (ref 19–32)
Calcium: 9.4 mg/dL (ref 8.4–10.5)
Creatinine, Ser: 1 mg/dL (ref 0.4–1.2)
GFR: 55.63 mL/min — ABNORMAL LOW (ref >60.00)
Glucose, Bld: 98 mg/dL (ref 70–99)
POTASSIUM: 4.3 meq/L (ref 3.5–5.1)
SODIUM: 140 meq/L (ref 135–145)

## 2014-04-10 LAB — CBC WITH DIFFERENTIAL/PLATELET
BASOS ABS: 0 10*3/uL (ref 0.0–0.1)
Basophils Relative: 0.2 % (ref 0.0–3.0)
Eosinophils Absolute: 0.3 10*3/uL (ref 0.0–0.7)
Eosinophils Relative: 5.2 % — ABNORMAL HIGH (ref 0.0–5.0)
HCT: 43.7 % (ref 36.0–46.0)
HEMOGLOBIN: 14.6 g/dL (ref 12.0–15.0)
Lymphocytes Relative: 27.1 % (ref 12.0–46.0)
Lymphs Abs: 1.4 10*3/uL (ref 0.7–4.0)
MCHC: 33.4 g/dL (ref 30.0–36.0)
MCV: 87.7 fl (ref 78.0–100.0)
MONO ABS: 0.7 10*3/uL (ref 0.1–1.0)
MONOS PCT: 14.4 % — AB (ref 3.0–12.0)
Neutro Abs: 2.8 10*3/uL (ref 1.4–7.7)
Neutrophils Relative %: 53.1 % (ref 43.0–77.0)
PLATELETS: 242 10*3/uL (ref 150.0–400.0)
RBC: 4.99 Mil/uL (ref 3.87–5.11)
RDW: 14.2 % (ref 11.5–15.5)
WBC: 5.2 10*3/uL (ref 4.0–10.5)

## 2014-04-10 LAB — HEPATIC FUNCTION PANEL
ALBUMIN: 3.6 g/dL (ref 3.5–5.2)
ALT: 13 U/L (ref 0–35)
AST: 18 U/L (ref 0–37)
Alkaline Phosphatase: 52 U/L (ref 39–117)
BILIRUBIN TOTAL: 0.4 mg/dL (ref 0.2–1.2)
Bilirubin, Direct: 0 mg/dL (ref 0.0–0.3)
Total Protein: 6.3 g/dL (ref 6.0–8.3)

## 2014-04-10 LAB — URINALYSIS, ROUTINE W REFLEX MICROSCOPIC
Bilirubin Urine: NEGATIVE
Hgb urine dipstick: NEGATIVE
KETONES UR: NEGATIVE
LEUKOCYTES UA: NEGATIVE
NITRITE: NEGATIVE
PH: 5.5 (ref 5.0–8.0)
RBC / HPF: NONE SEEN (ref 0–?)
Specific Gravity, Urine: >=1.03 — AB (ref 1.000–1.030)
TOTAL PROTEIN, URINE-UPE24: NEGATIVE
Urine Glucose: NEGATIVE
Urobilinogen, UA: 0.2 (ref 0.0–1.0)

## 2014-04-10 LAB — LIPID PANEL
CHOLESTEROL: 207 mg/dL — AB (ref 0–200)
HDL: 82.4 mg/dL (ref >39.00)
LDL CALC: 107 mg/dL — AB (ref 0–99)
NonHDL: 124.6
TRIGLYCERIDES: 89 mg/dL (ref 0.0–149.0)
Total CHOL/HDL Ratio: 3
VLDL: 17.8 mg/dL (ref 0.0–40.0)

## 2014-04-10 MED ORDER — GUAIFENESIN-CODEINE 100-10 MG/5ML PO SYRP: 5.0000 mL | ORAL_SOLUTION | Freq: Four times a day (QID) | ORAL | Status: DC | PRN

## 2014-04-10 NOTE — Assessment & Plan Note (Signed)
On Evista therapy and takes vitamin D plus calcium for same DEXA 12/2007 and 03/29/12: -1.5, schedule follow up now @ [facility] No history fracture, no bone pain, but reviewed dental implant problems over 2014 and early 2015. Active with weightbearing activity Check Vit D next labs

## 2014-04-10 NOTE — Progress Notes (Signed)
Subjective:    Patient ID: Terry HewsNancy B Robbins, female    DOB: 05/11/1934, 80 y.o.   MRN: [id number]  HPI   Here for annual wellness and physcial  Diet: heart healthy Physical activity: active, walking Depression/mood screen: negative Hearing: intact to whispered voice Visual acuity: grossly normal, performs annual eye exam  ADLs: capable Fall risk: none Home safety: good Cognitive evaluation: intact to orientation, naming, recall and repetition EOL planning: adv directives, full code/ I agree  I have personally reviewed and have noted 1. The patient's medical and social history 2. Their use of alcohol, tobacco or illicit drugs 3. Their current medications and supplements 4. The patient's functional ability including ADL's, fall risks, home safety risks and hearing or visual impairment. 5. Diet and physical activities 6. Evidence for depression or mood disorders  Also reviewed chronic medical issues and interval medical events  Past Medical History  Diagnosis Date  . ARTHRITIS   . MACULAR DEGENERATION   . Empyema, left 04/2010    related to PNA; s/p VATS/minithoracotomy & bronch  . OA (osteoarthritis)   . OSTEOPOROSIS   . Psoriasis    Family History  Problem Relation Age of Onset  . Diabetes Other     Parent  . Hypertension Other     parent  . Dementia Other     Parent  . Lung cancer Other     Parent   History  Substance Use Topics  . Smoking status: Former Smoker    Types: Cigarettes    Quit date: 06/07/1977  . Smokeless tobacco: Not on file     Comment: Divorced, lives alone- Very active at [employer], church. retired from [employer] 2006  . Alcohol Use: No     Review of Systems  Constitutional: Negative for fatigue and unexpected weight change.  Respiratory: Negative for cough, shortness of breath and wheezing.   Cardiovascular: Negative for chest pain, palpitations and leg swelling.  Gastrointestinal: Negative for nausea, abdominal pain and diarrhea.    Neurological: Negative for dizziness, weakness, light-headedness and headaches.  Psychiatric/Behavioral: Negative for dysphoric mood. The patient is not nervous/anxious.   All other systems reviewed and are negative.      Objective:   Physical Exam  BP 124/64 mmHg  Pulse 68  Temp(Src) 97.6 F (36.4 C) (Oral)  Ht 5\' 5"  (1.651 m)  Wt 132 lb (59.875 kg)  BMI 21.97 kg/m2  SpO2 97% Wt Readings from Last 3 Encounters:  04/10/14 132 lb (59.875 kg)  01/22/14 131 lb 1.9 oz (59.476 kg)  09/21/13 131 lb 6.4 oz (59.603 kg)   Constitutional: She appears well-developed and well-nourished. No distress.  HENT: Head: Normocephalic and atraumatic. Ears: B TMs ok, no erythema or effusion; Nose: Nose normal. Mouth/Throat: Oropharynx is clear and moist. No oropharyngeal exudate.  Eyes: Conjunctivae and EOM are normal. Pupils are equal, round, and reactive to light. No scleral icterus.  Neck: Normal range of motion. Neck supple. No JVD present. No thyromegaly present.  Cardiovascular: Normal rate, regular rhythm and normal heart sounds.  No murmur heard. No BLE edema. Pulmonary/Chest: Effort normal and breath sounds normal. No respiratory distress. She has no wheezes.  Abdominal: Soft. Bowel sounds are normal. She exhibits no distension. There is no tenderness. no masses GU/breast: defer to gyn Musculoskeletal: Normal range of motion, no joint effusions. No gross deformities Neurological: She is alert and oriented to person, place, and time. No cranial nerve deficit. Coordination, balance, strength, speech and gait are normal.  Skin:  Skin is warm and dry. No rash noted. No erythema.  Psychiatric: She has a normal mood and affect. Her behavior is normal. Judgment and thought content normal.    Lab Results  Component Value Date   WBC 7.1 05/24/2013   HGB 13.6 05/24/2013   HCT 40.8 05/24/2013   PLT 273.0 05/24/2013   GLUCOSE 101* 05/24/2013   CHOL 184 05/24/2013   TRIG 77.0 05/24/2013   HDL  78.60 05/24/2013   LDLCALC 90 05/24/2013   ALT 13 05/24/2013   AST 16 05/24/2013   NA 141 05/24/2013   K 3.8 05/24/2013   CL 107 05/24/2013   CREATININE 0.8 05/24/2013   BUN 21 05/24/2013   CO2 27 05/24/2013   TSH 1.48 05/24/2013   INR 1.06 05/29/2010   HGBA1C 5.6 03/13/2012    Mm Digital Screening 3d Tomo  05/16/2013   CLINICAL DATA:  Screening.  EXAM: DIGITAL SCREENING BILATERAL MAMMOGRAM WITH 3D TOMO WITH CAD  DIGITAL BREAST TOMOSYNTHESIS  Digital breast tomosynthesis images are acquired in two projections. These images are reviewed in combination with the digital mammogram, confirming the findings below.  COMPARISON:  Previous exam(s).  ACR Breast Density Category b: There are scattered areas of fibroglandular density.  FINDINGS: There are no findings suspicious for malignancy. Images were processed with CAD.  IMPRESSION: No mammographic evidence of malignancy. A result letter of this screening mammogram will be mailed directly to the patient.  RECOMMENDATION: Screening mammogram in one year. (Code:[id number])  BI-RADS CATEGORY  1: Negative   Electronically Signed   By: Esperanza Heiraymond  Rubner M.D.   On: 05/16/2013 17:14       Assessment & Plan:   CPX/AWV/z00.00 - Today patient counseled on age appropriate routine health concerns for screening and prevention, each reviewed and up to date or declined. Immunizations reviewed and up to date or declined. Labs ordered and reviewed. Risk factors for depression reviewed and negative. Hearing function and visual acuity are intact. ADLs screened and addressed as needed. Functional ability and level of safety reviewed and appropriate. Education, counseling and referrals performed based on assessed risks today. Patient provided with a copy of personalized plan for preventive services.  Problem List Items Addressed This Visit    Osteoporosis    On Evista therapy and takes vitamin D plus calcium for same DEXA 12/2007 and 03/29/12: -1.5, schedule follow up  now @ [facility] No history fracture, no bone pain, but reviewed dental implant problems over 2014 and early 2015. Active with weightbearing activity Check Vit D next labs    Relevant Orders      DG Bone Density      Vit D  25 hydroxy (rtn osteoporosis monitoring)    Other Visit Diagnoses    Routine general medical examination at a health care facility    -  Primary    Relevant Orders       Basic metabolic panel       CBC with Differential       Hepatic function panel       Lipid panel       TSH       Urinalysis, Routine w reflex microscopic    Need for prophylactic vaccination and inoculation against influenza        Relevant Orders       Flu vaccine HIGH DOSE PF (Fluzone Tri High dose) (Completed)

## 2014-04-10 NOTE — Patient Instructions (Addendum)
It was good to see you today.  We have reviewed your prior records including labs and tests today  Health Maintenance reviewed - all recommended immunizations and age-appropriate screenings are up-to-date.  Test(s) ordered today. Your results will be released to [place] (or called to you) after review, usually within 72hours after test completion. If any changes need to be made, you will be notified at that same time.  Medications reviewed and updated, no changes recommended at this time.  Please schedule followup in 12 months for annual exam and labs, call sooner if problems.  Health Maintenance Adopting a healthy lifestyle and getting preventive care can go a long way to promote health and wellness. Talk with your health care provider about what schedule of regular examinations is right for you. This is a good chance for you to check in with your provider about disease prevention and staying healthy. In between checkups, there are plenty of things you can do on your own. Experts have done a lot of research about which lifestyle changes and preventive measures are most likely to keep you healthy. Ask your health care provider for more information. WEIGHT AND DIET  Eat a healthy diet  Be sure to include plenty of vegetables, fruits, low-fat dairy products, and lean protein.  Do not eat a lot of foods high in solid fats, added sugars, or salt.  Get regular exercise. This is one of the most important things you can do for your health.  Most adults should exercise for at least 150 minutes each week. The exercise should increase your heart rate and make you sweat (moderate-intensity exercise).  Most adults should also do strengthening exercises at least twice a week. This is in addition to the moderate-intensity exercise.  Maintain a healthy weight  Body mass index (BMI) is a measurement that can be used to identify possible weight problems. It estimates body fat based on height and weight.  Your health care provider can help determine your BMI and help you achieve or maintain a healthy weight.  For females 20 years of age and older:   A BMI below 18.5 is considered underweight.  A BMI of 18.5 to 24.9 is normal.  A BMI of 25 to 29.9 is considered overweight.  A BMI of 30 and above is considered obese.  Watch levels of cholesterol and blood lipids  You should start having your blood tested for lipids and cholesterol at 78 years of age, then have this test every 5 years.  You may need to have your cholesterol levels checked more often if:  Your lipid or cholesterol levels are high.  You are older than 78 years of age.  You are at high risk for heart disease.  CANCER SCREENING   Lung Cancer  Lung cancer screening is recommended for adults 1-59 years old who are at high risk for lung cancer because of a history of smoking.  A yearly low-dose CT scan of the lungs is recommended for people who:  Currently smoke.  Have quit within the past 15 years.  Have at least a 30-pack-year history of smoking. A pack year is smoking an average of one pack of cigarettes a day for 1 year.  Yearly screening should continue until it has been 15 years since you quit.  Yearly screening should stop if you develop a health problem that would prevent you from having lung cancer treatment.  Breast Cancer  Practice breast self-awareness. This means understanding how your breasts normally appear and  feel.  It also means doing regular breast self-exams. Let your health care provider know about any changes, no matter how small.  If you are in your 20s or 30s, you should have a clinical breast exam (CBE) by a health care provider every 1-3 years as part of a regular health exam.  If you are 40 or older, have a CBE every year. Also consider having a breast X-ray (mammogram) every year.  If you have a family history of breast cancer, talk to your health care provider about genetic  screening.  If you are at high risk for breast cancer, talk to your health care provider about having an MRI and a mammogram every year.  Breast cancer gene (BRCA) assessment is recommended for women who have family members with BRCA-related cancers. BRCA-related cancers include:  Breast.  Ovarian.  Tubal.  Peritoneal cancers.  Results of the assessment will determine the need for genetic counseling and BRCA1 and BRCA2 testing. Cervical Cancer Routine pelvic examinations to screen for cervical cancer are no longer recommended for nonpregnant women who are considered low risk for cancer of the pelvic organs (ovaries, uterus, and vagina) and who do not have symptoms. A pelvic examination may be necessary if you have symptoms including those associated with pelvic infections. Ask your health care provider if a screening pelvic exam is right for you.   The Pap test is the screening test for cervical cancer for women who are considered at risk.  If you had a hysterectomy for a problem that was not cancer or a condition that could lead to cancer, then you no longer need Pap tests.  If you are older than 65 years, and you have had normal Pap tests for the past 10 years, you no longer need to have Pap tests.  If you have had past treatment for cervical cancer or a condition that could lead to cancer, you need Pap tests and screening for cancer for at least 20 years after your treatment.  If you no longer get a Pap test, assess your risk factors if they change (such as having a new sexual partner). This can affect whether you should start being screened again.  Some women have medical problems that increase their chance of getting cervical cancer. If this is the case for you, your health care provider may recommend more frequent screening and Pap tests.  The human papillomavirus (HPV) test is another test that may be used for cervical cancer screening. The HPV test looks for the virus that can  cause cell changes in the cervix. The cells collected during the Pap test can be tested for HPV.  The HPV test can be used to screen women 30 years of age and older. Getting tested for HPV can extend the interval between normal Pap tests from three to five years.  An HPV test also should be used to screen women of any age who have unclear Pap test results.  After 78 years of age, women should have HPV testing as often as Pap tests.  Colorectal Cancer  This type of cancer can be detected and often prevented.  Routine colorectal cancer screening usually begins at 78 years of age and continues through 78 years of age.  Your health care provider may recommend screening at an earlier age if you have risk factors for colon cancer.  Your health care provider may also recommend using home test kits to check for hidden blood in the stool.  A small camera   at the end of a tube can be used to examine your colon directly (sigmoidoscopy or colonoscopy). This is done to check for the earliest forms of colorectal cancer.  Routine screening usually begins at age 50.  Direct examination of the colon should be repeated every 5-10 years through 78 years of age. However, you may need to be screened more often if early forms of precancerous polyps or small growths are found. Skin Cancer  Check your skin from head to toe regularly.  Tell your health care provider about any new moles or changes in moles, especially if there is a change in a mole's shape or color.  Also tell your health care provider if you have a mole that is larger than the size of a pencil eraser.  Always use sunscreen. Apply sunscreen liberally and repeatedly throughout the day.  Protect yourself by wearing long sleeves, pants, a wide-brimmed hat, and sunglasses whenever you are outside. HEART DISEASE, DIABETES, AND HIGH BLOOD PRESSURE   Have your blood pressure checked at least every 1-2 years. High blood pressure causes heart  disease and increases the risk of stroke.  If you are between 55 years and 79 years old, ask your health care provider if you should take aspirin to prevent strokes.  Have regular diabetes screenings. This involves taking a blood sample to check your fasting blood sugar level.  If you are at a normal weight and have a low risk for diabetes, have this test once every three years after 78 years of age.  If you are overweight and have a high risk for diabetes, consider being tested at a younger age or more often. PREVENTING INFECTION  Hepatitis B  If you have a higher risk for hepatitis B, you should be screened for this virus. You are considered at high risk for hepatitis B if:  You were born in a country where hepatitis B is common. Ask your health care provider which countries are considered high risk.  Your parents were born in a high-risk country, and you have not been immunized against hepatitis B (hepatitis B vaccine).  You have HIV or AIDS.  You use needles to inject street drugs.  You live with someone who has hepatitis B.  You have had sex with someone who has hepatitis B.  You get hemodialysis treatment.  You take certain medicines for conditions, including cancer, organ transplantation, and autoimmune conditions. Hepatitis C  Blood testing is recommended for:  Everyone born from 1945 through 1965.  Anyone with known risk factors for hepatitis C. Sexually transmitted infections (STIs)  You should be screened for sexually transmitted infections (STIs) including gonorrhea and chlamydia if:  You are sexually active and are younger than 78 years of age.  You are older than 78 years of age and your health care provider tells you that you are at risk for this type of infection.  Your sexual activity has changed since you were last screened and you are at an increased risk for chlamydia or gonorrhea. Ask your health care provider if you are at risk.  If you do not have  HIV, but are at risk, it may be recommended that you take a prescription medicine daily to prevent HIV infection. This is called pre-exposure prophylaxis (PrEP). You are considered at risk if:  You are sexually active and do not regularly use condoms or know the HIV status of your partner(s).  You take drugs by injection.  You are sexually active with a partner   who has HIV. Talk with your health care provider about whether you are at high risk of being infected with HIV. If you choose to begin PrEP, you should first be tested for HIV. You should then be tested every 3 months for as long as you are taking PrEP.  PREGNANCY   If you are premenopausal and you may become pregnant, ask your health care provider about preconception counseling.  If you may become pregnant, take 400 to 800 micrograms (mcg) of folic acid every day.  If you want to prevent pregnancy, talk to your health care provider about birth control (contraception). OSTEOPOROSIS AND MENOPAUSE   Osteoporosis is a disease in which the bones lose minerals and strength with aging. This can result in serious bone fractures. Your risk for osteoporosis can be identified using a bone density scan.  If you are 65 years of age or older, or if you are at risk for osteoporosis and fractures, ask your health care provider if you should be screened.  Ask your health care provider whether you should take a calcium or vitamin D supplement to lower your risk for osteoporosis.  Menopause may have certain physical symptoms and risks.  Hormone replacement therapy may reduce some of these symptoms and risks. Talk to your health care provider about whether hormone replacement therapy is right for you.  HOME CARE INSTRUCTIONS   Schedule regular health, dental, and eye exams.  Stay current with your immunizations.   Do not use any tobacco products including cigarettes, chewing tobacco, or electronic cigarettes.  If you are pregnant, do not  drink alcohol.  If you are breastfeeding, limit how much and how often you drink alcohol.  Limit alcohol intake to no more than 1 drink per day for nonpregnant women. One drink equals 12 ounces of beer, 5 ounces of wine, or 1 ounces of hard liquor.  Do not use street drugs.  Do not share needles.  Ask your health care provider for help if you need support or information about quitting drugs.  Tell your health care provider if you often feel depressed.  Tell your health care provider if you have ever been abused or do not feel safe at home. Document Released: 12/07/2010 Document Revised: 10/08/2013 Document Reviewed: 04/25/2013 Exit[employer] Patient Information 2015 [employer]. This information is not intended to replace advice given to you by your health care provider. Make sure you discuss any questions you have with your health care provider.  

## 2014-04-10 NOTE — Progress Notes (Signed)
Pre visit review using our clinic review tool, if applicable. No additional management support is needed unless otherwise documented below in the visit note. 

## 2014-04-11 LAB — VITAMIN D 25 HYDROXY (VIT D DEFICIENCY, FRACTURES): VITD: 89.13 ng/mL (ref 30.00–100.00)

## 2014-04-11 LAB — TSH: TSH: 1.65 u[IU]/mL (ref 0.35–4.50)

## 2014-04-16 ENCOUNTER — Other Ambulatory Visit: Payer: Self-pay | Admitting: Internal Medicine

## 2014-04-16 DIAGNOSIS — Z1231 Encounter for screening mammogram for malignant neoplasm of breast: Secondary | ICD-10-CM

## 2014-04-25 ENCOUNTER — Ambulatory Visit (INDEPENDENT_AMBULATORY_CARE_PROVIDER_SITE_OTHER)
Admission: RE | Admit: 2014-04-25 | Discharge: 2014-04-25 | Disposition: A | Discharge status: Home / Self Care | Payer: Medicare Other | Source: Ambulatory Visit | Attending: Internal Medicine | Admitting: Internal Medicine

## 2014-04-25 DIAGNOSIS — M810 Age-related osteoporosis without current pathological fracture: Secondary | ICD-10-CM

## 2014-04-29 ENCOUNTER — Telehealth: Payer: Self-pay | Admitting: Internal Medicine

## 2014-04-29 NOTE — Telephone Encounter (Signed)
Pt wants to ask if the generic Raloxifene would it be okay to replace Evista (90 day) . Pls let pt know.

## 2014-04-29 NOTE — Telephone Encounter (Signed)
Yes - ok to use generic (it is same med as Evista, but generic) -  Same dose and freq - ok to send same and let pt know Thanks!

## 2014-04-30 ENCOUNTER — Encounter: Payer: Self-pay | Admitting: Internal Medicine

## 2014-05-01 NOTE — Telephone Encounter (Signed)
Pt informed that generic is ok. And will order on 05/26/14 for pt.

## 2014-05-20 ENCOUNTER — Ambulatory Visit (HOSPITAL_COMMUNITY): Payer: Medicare Other

## 2014-05-21 ENCOUNTER — Ambulatory Visit (HOSPITAL_COMMUNITY)
Admission: RE | Admit: 2014-05-21 | Discharge: 2014-05-21 | Disposition: A | Discharge status: Home / Self Care | Payer: Medicare Other | Source: Ambulatory Visit | Attending: Internal Medicine | Admitting: Internal Medicine

## 2014-05-21 DIAGNOSIS — Z1231 Encounter for screening mammogram for malignant neoplasm of breast: Secondary | ICD-10-CM | POA: Diagnosis present

## 2014-08-11 ENCOUNTER — Other Ambulatory Visit: Payer: Self-pay | Admitting: Internal Medicine

## 2014-09-09 DIAGNOSIS — H3531 Nonexudative age-related macular degeneration: Secondary | ICD-10-CM | POA: Diagnosis not present

## 2014-09-09 DIAGNOSIS — H2513 Age-related nuclear cataract, bilateral: Secondary | ICD-10-CM | POA: Diagnosis not present

## 2014-11-26 ENCOUNTER — Encounter: Payer: Self-pay | Admitting: Internal Medicine

## 2014-11-26 ENCOUNTER — Ambulatory Visit (INDEPENDENT_AMBULATORY_CARE_PROVIDER_SITE_OTHER): Payer: Medicare Other | Admitting: Internal Medicine

## 2014-11-26 VITALS — BP 110/62 | HR 71 | Temp 98.3°F | Resp 14 | Ht 65.0 in | Wt 131.8 lb

## 2014-11-26 DIAGNOSIS — K219 Gastro-esophageal reflux disease without esophagitis: Secondary | ICD-10-CM | POA: Diagnosis not present

## 2014-11-26 NOTE — Progress Notes (Signed)
Pre visit review using our clinic review tool, if applicable. No additional management support is needed unless otherwise documented below in the visit note. 

## 2014-11-26 NOTE — Patient Instructions (Signed)
We think that the stomach problem will pass on its own. You can try to limit the portion size and maybe have 3 smaller meals instead of the 2 large meals per day.   I have also given you information about gas producing foods so you can try to make some diet changes if this does not get better.   Come back in November for the physical and call us if you have any problems or questions before then.

## 2014-11-29 NOTE — Assessment & Plan Note (Signed)
This stomach discomfort could be related and talked to her about smaller frequent meals, not eating and lying down. Also talked to her about dietary choices to limit gas in the stomach. Okay for her to try gas-x over the counter. She can also try align if she wants. No need for further evaluation at this time as it is resolving on its own. If it worsens or stays she will come back.

## 2014-11-29 NOTE — Progress Notes (Signed)
   Subjective:    Patient ID: Terry Robbins, female    DOB: 10-13-1933, 81 y.o.   MRN: [id number]  HPI The patient is a 81 YO female coming in for stomach bloating and discomfort. She noticed it start about 4 days ago. Has improved some since that time. She notices that eating larger meals makes it worse and causes some bloating. Normal BMs and no diarrhea or constipation. Some foods seem to make it worse. She has not tried anything for it. Has had similar problem in the past and align seems to help then. No weight loss, fevers, rash.   Review of Systems  Constitutional: Negative.   Respiratory: Negative.   Cardiovascular: Negative.   Gastrointestinal: Positive for abdominal distention. Negative for nausea, vomiting, abdominal pain, diarrhea, constipation and blood in stool.  Musculoskeletal: Negative.   Neurological: Negative.       Objective:   Physical Exam  Constitutional: She appears well-developed and well-nourished.  HENT:  Head: Normocephalic and atraumatic.  Eyes: EOM are normal.  Neck: Normal range of motion.  Cardiovascular: Normal rate and regular rhythm.   Pulmonary/Chest: Effort normal and breath sounds normal. No respiratory distress. She has no wheezes. She has no rales.  Abdominal: Soft. Bowel sounds are normal. She exhibits no distension. There is no tenderness. There is no rebound.  Skin: Skin is warm and dry. No rash noted.   Filed Vitals:   11/26/14 1422  BP: 110/62  Pulse: 71  Temp: 98.3 F (36.8 C)  TempSrc: Oral  Resp: 14  Height: 5\' 5"  (1.651 m)  Weight: 131 lb 12.8 oz (59.784 kg)  SpO2: 97%      Assessment & Plan:

## 2015-03-06 ENCOUNTER — Ambulatory Visit: Payer: Medicare Other

## 2015-03-13 ENCOUNTER — Ambulatory Visit (INDEPENDENT_AMBULATORY_CARE_PROVIDER_SITE_OTHER): Payer: Medicare Other

## 2015-03-13 DIAGNOSIS — Z23 Encounter for immunization: Secondary | ICD-10-CM

## 2015-03-24 ENCOUNTER — Telehealth: Payer: Self-pay

## 2015-03-24 NOTE — Telephone Encounter (Signed)
Patient called to educate on Medicare Wellness apt. LVM for the patient to call back to educate and schedule for wellness visit.  Apt for 11/7 at 8:30 and attempting to schedule AWV separately

## 2015-03-26 ENCOUNTER — Telehealth: Payer: Self-pay

## 2015-03-26 NOTE — Telephone Encounter (Signed)
Called the patient back regarding AWV as she left VM; Left another VM today and stated what the Wellness exam was and that this was optional if she can come in. Left message for return call.

## 2015-03-26 NOTE — Telephone Encounter (Signed)
Placed call to Terry Robbins this am as she has left a VM stating she has made multiple attempts to reach me. Called her back to let her know her message was recorded at 4:13 pm yesterday and I am sorry she has made "multiple" attempts to reach me but I did not receive a message until yesterday. It is fine to come on 11/7 as planned and see only Dr. Okey Duprerawford and she can complete her AWV with her this year and does not need a separate visit;   Thanked her again for calling and apologized for any confusion.

## 2015-03-27 NOTE — Telephone Encounter (Signed)
Error; already documented AWV call

## 2015-04-14 ENCOUNTER — Other Ambulatory Visit (INDEPENDENT_AMBULATORY_CARE_PROVIDER_SITE_OTHER): Payer: Medicare Other

## 2015-04-14 ENCOUNTER — Encounter: Payer: Self-pay | Admitting: Internal Medicine

## 2015-04-14 ENCOUNTER — Other Ambulatory Visit: Payer: Self-pay

## 2015-04-14 ENCOUNTER — Ambulatory Visit (INDEPENDENT_AMBULATORY_CARE_PROVIDER_SITE_OTHER): Payer: Medicare Other | Admitting: Internal Medicine

## 2015-04-14 VITALS — BP 132/78 | HR 77 | Temp 98.3°F | Resp 14 | Ht 65.0 in | Wt 126.4 lb

## 2015-04-14 DIAGNOSIS — Z1231 Encounter for screening mammogram for malignant neoplasm of breast: Secondary | ICD-10-CM

## 2015-04-14 DIAGNOSIS — Z23 Encounter for immunization: Secondary | ICD-10-CM | POA: Diagnosis not present

## 2015-04-14 DIAGNOSIS — Z0000 Encounter for general adult medical examination without abnormal findings: Secondary | ICD-10-CM | POA: Diagnosis not present

## 2015-04-14 LAB — CBC
HCT: 47.3 % — ABNORMAL HIGH (ref 36.0–46.0)
Hemoglobin: 15.6 g/dL — ABNORMAL HIGH (ref 12.0–15.0)
MCHC: 33 g/dL (ref 30.0–36.0)
MCV: 89.9 fl (ref 78.0–100.0)
Platelets: 284 10*3/uL (ref 150.0–400.0)
RBC: 5.27 Mil/uL — ABNORMAL HIGH (ref 3.87–5.11)
RDW: 13.6 % (ref 11.5–15.5)
WBC: 8.7 10*3/uL (ref 4.0–10.5)

## 2015-04-14 LAB — LIPID PANEL
CHOL/HDL RATIO: 2
Cholesterol: 211 mg/dL — ABNORMAL HIGH (ref 0–200)
HDL: 84.8 mg/dL (ref >39.00)
LDL Cholesterol: 106 mg/dL — ABNORMAL HIGH (ref 0–99)
NONHDL: 126.3
Triglycerides: 102 mg/dL (ref 0.0–149.0)
VLDL: 20.4 mg/dL (ref 0.0–40.0)

## 2015-04-14 LAB — COMPREHENSIVE METABOLIC PANEL
ALT: 11 U/L (ref 0–35)
AST: 15 U/L (ref 0–37)
Albumin: 4.2 g/dL (ref 3.5–5.2)
Alkaline Phosphatase: 59 U/L (ref 39–117)
BILIRUBIN TOTAL: 0.5 mg/dL (ref 0.2–1.2)
BUN: 17 mg/dL (ref 6–23)
CO2: 30 meq/L (ref 19–32)
Calcium: 9.7 mg/dL (ref 8.4–10.5)
Chloride: 104 mEq/L (ref 96–112)
Creatinine, Ser: 0.95 mg/dL (ref 0.40–1.20)
GFR: 60.23 mL/min (ref >60.00)
GLUCOSE: 111 mg/dL — AB (ref 70–99)
Potassium: 4.3 mEq/L (ref 3.5–5.1)
SODIUM: 144 meq/L (ref 135–145)
Total Protein: 7 g/dL (ref 6.0–8.3)

## 2015-04-14 NOTE — Patient Instructions (Signed)
We have given you the pneumonia shot and the tetanus shot today.   We are checking your labs and will call you back with the results.   Health Maintenance, Female Adopting a healthy lifestyle and getting preventive care can go a long way to promote health and wellness. Talk with your health care provider about what schedule of regular examinations is right for you. This is a good chance for you to check in with your provider about disease prevention and staying healthy. In between checkups, there are plenty of things you can do on your own. Experts have done a lot of research about which lifestyle changes and preventive measures are most likely to keep you healthy. Ask your health care provider for more information. WEIGHT AND DIET  Eat a healthy diet  Be sure to include plenty of vegetables, fruits, low-fat dairy products, and lean protein.  Do not eat a lot of foods high in solid fats, added sugars, or salt.  Get regular exercise. This is one of the most important things you can do for your health.  Most adults should exercise for at least 150 minutes each week. The exercise should increase your heart rate and make you sweat (moderate-intensity exercise).  Most adults should also do strengthening exercises at least twice a week. This is in addition to the moderate-intensity exercise.  Maintain a healthy weight  Body mass index (BMI) is a measurement that can be used to identify possible weight problems. It estimates body fat based on height and weight. Your health care provider can help determine your BMI and help you achieve or maintain a healthy weight.  For females 40 years of age and older:   A BMI below 18.5 is considered underweight.  A BMI of 18.5 to 24.9 is normal.  A BMI of 25 to 29.9 is considered overweight.  A BMI of 30 and above is considered obese.  Watch levels of cholesterol and blood lipids  You should start having your blood tested for lipids and cholesterol  at 79 years of age, then have this test every 5 years.  You may need to have your cholesterol levels checked more often if:  Your lipid or cholesterol levels are high.  You are older than 79 years of age.  You are at high risk for heart disease.  CANCER SCREENING   Lung Cancer  Lung cancer screening is recommended for adults 52-28 years old who are at high risk for lung cancer because of a history of smoking.  A yearly low-dose CT scan of the lungs is recommended for people who:  Currently smoke.  Have quit within the past 15 years.  Have at least a 30-pack-year history of smoking. A pack year is smoking an average of one pack of cigarettes a day for 1 year.  Yearly screening should continue until it has been 15 years since you quit.  Yearly screening should stop if you develop a health problem that would prevent you from having lung cancer treatment.  Breast Cancer  Practice breast self-awareness. This means understanding how your breasts normally appear and feel.  It also means doing regular breast self-exams. Let your health care provider know about any changes, no matter how small.  If you are in your 20s or 30s, you should have a clinical breast exam (CBE) by a health care provider every 1-3 years as part of a regular health exam.  If you are 66 or older, have a CBE every year. Also consider  having a breast X-ray (mammogram) every year.  If you have a family history of breast cancer, talk to your health care provider about genetic screening.  If you are at high risk for breast cancer, talk to your health care provider about having an MRI and a mammogram every year.  Breast cancer gene (BRCA) assessment is recommended for women who have family members with BRCA-related cancers. BRCA-related cancers include:  Breast.  Ovarian.  Tubal.  Peritoneal cancers.  Results of the assessment will determine the need for genetic counseling and BRCA1 and BRCA2  testing. Cervical Cancer Your health care provider may recommend that you be screened regularly for cancer of the pelvic organs (ovaries, uterus, and vagina). This screening involves a pelvic examination, including checking for microscopic changes to the surface of your cervix (Pap test). You may be encouraged to have this screening done every 3 years, beginning at age 39.  For women ages 2-65, health care providers may recommend pelvic exams and Pap testing every 3 years, or they may recommend the Pap and pelvic exam, combined with testing for human papilloma virus (HPV), every 5 years. Some types of HPV increase your risk of cervical cancer. Testing for HPV may also be done on women of any age with unclear Pap test results.  Other health care providers may not recommend any screening for nonpregnant women who are considered low risk for pelvic cancer and who do not have symptoms. Ask your health care provider if a screening pelvic exam is right for you.  If you have had past treatment for cervical cancer or a condition that could lead to cancer, you need Pap tests and screening for cancer for at least 20 years after your treatment. If Pap tests have been discontinued, your risk factors (such as having a new sexual partner) need to be reassessed to determine if screening should resume. Some women have medical problems that increase the chance of getting cervical cancer. In these cases, your health care provider may recommend more frequent screening and Pap tests. Colorectal Cancer  This type of cancer can be detected and often prevented.  Routine colorectal cancer screening usually begins at 79 years of age and continues through 79 years of age.  Your health care provider may recommend screening at an earlier age if you have risk factors for colon cancer.  Your health care provider may also recommend using home test kits to check for hidden blood in the stool.  A small camera at the end of a  tube can be used to examine your colon directly (sigmoidoscopy or colonoscopy). This is done to check for the earliest forms of colorectal cancer.  Routine screening usually begins at age 73.  Direct examination of the colon should be repeated every 5-10 years through 79 years of age. However, you may need to be screened more often if early forms of precancerous polyps or small growths are found. Skin Cancer  Check your skin from head to toe regularly.  Tell your health care provider about any new moles or changes in moles, especially if there is a change in a mole's shape or color.  Also tell your health care provider if you have a mole that is larger than the size of a pencil eraser.  Always use sunscreen. Apply sunscreen liberally and repeatedly throughout the day.  Protect yourself by wearing long sleeves, pants, a wide-brimmed hat, and sunglasses whenever you are outside. HEART DISEASE, DIABETES, AND HIGH BLOOD PRESSURE   High blood  pressure causes heart disease and increases the risk of stroke. High blood pressure is more likely to develop in:  People who have blood pressure in the high end of the normal range (130-139/85-89 mm Hg).  People who are overweight or obese.  People who are African American.  If you are 48-67 years of age, have your blood pressure checked every 3-5 years. If you are 48 years of age or older, have your blood pressure checked every year. You should have your blood pressure measured twice--once when you are at a hospital or clinic, and once when you are not at a hospital or clinic. Record the average of the two measurements. To check your blood pressure when you are not at a hospital or clinic, you can use:  An automated blood pressure machine at a pharmacy.  A home blood pressure monitor.  If you are between 85 years and 8 years old, ask your health care provider if you should take aspirin to prevent strokes.  Have regular diabetes screenings. This  involves taking a blood sample to check your fasting blood sugar level.  If you are at a normal weight and have a low risk for diabetes, have this test once every three years after 79 years of age.  If you are overweight and have a high risk for diabetes, consider being tested at a younger age or more often. PREVENTING INFECTION  Hepatitis B  If you have a higher risk for hepatitis B, you should be screened for this virus. You are considered at high risk for hepatitis B if:  You were born in a country where hepatitis B is common. Ask your health care provider which countries are considered high risk.  Your parents were born in a high-risk country, and you have not been immunized against hepatitis B (hepatitis B vaccine).  You have HIV or AIDS.  You use needles to inject street drugs.  You live with someone who has hepatitis B.  You have had sex with someone who has hepatitis B.  You get hemodialysis treatment.  You take certain medicines for conditions, including cancer, organ transplantation, and autoimmune conditions. Hepatitis C  Blood testing is recommended for:  Everyone born from [birth year] through 1963.  Anyone with known risk factors for hepatitis C. Sexually transmitted infections (STIs)  You should be screened for sexually transmitted infections (STIs) including gonorrhea and chlamydia if:  You are sexually active and are younger than 79 years of age.  You are older than 79 years of age and your health care provider tells you that you are at risk for this type of infection.  Your sexual activity has changed since you were last screened and you are at an increased risk for chlamydia or gonorrhea. Ask your health care provider if you are at risk.  If you do not have HIV, but are at risk, it may be recommended that you take a prescription medicine daily to prevent HIV infection. This is called pre-exposure prophylaxis (PrEP). You are considered at risk if:  You are  sexually active and do not regularly use condoms or know the HIV status of your partner(s).  You take drugs by injection.  You are sexually active with a partner who has HIV. Talk with your health care provider about whether you are at high risk of being infected with HIV. If you choose to begin PrEP, you should first be tested for HIV. You should then be tested every 3 months for as long as  you are taking PrEP.  PREGNANCY   If you are premenopausal and you may become pregnant, ask your health care provider about preconception counseling.  If you may become pregnant, take 400 to 800 micrograms (mcg) of folic acid every day.  If you want to prevent pregnancy, talk to your health care provider about birth control (contraception). OSTEOPOROSIS AND MENOPAUSE   Osteoporosis is a disease in which the bones lose minerals and strength with aging. This can result in serious bone fractures. Your risk for osteoporosis can be identified using a bone density scan.  If you are 21 years of age or older, or if you are at risk for osteoporosis and fractures, ask your health care provider if you should be screened.  Ask your health care provider whether you should take a calcium or vitamin D supplement to lower your risk for osteoporosis.  Menopause may have certain physical symptoms and risks.  Hormone replacement therapy may reduce some of these symptoms and risks. Talk to your health care provider about whether hormone replacement therapy is right for you.  HOME CARE INSTRUCTIONS   Schedule regular health, dental, and eye exams.  Stay current with your immunizations.   Do not use any tobacco products including cigarettes, chewing tobacco, or electronic cigarettes.  If you are pregnant, do not drink alcohol.  If you are breastfeeding, limit how much and how often you drink alcohol.  Limit alcohol intake to no more than 1 drink per day for nonpregnant women. One drink equals 12 ounces of beer, 5  ounces of wine, or 1 ounces of hard liquor.  Do not use street drugs.  Do not share needles.  Ask your health care provider for help if you need support or information about quitting drugs.  Tell your health care provider if you often feel depressed.  Tell your health care provider if you have ever been abused or do not feel safe at home.   This information is not intended to replace advice given to you by your health care provider. Make sure you discuss any questions you have with your health care provider.   Document Released: 12/07/2010 Document Revised: 06/14/2014 Document Reviewed: 04/25/2013 Elsevier Interactive Patient Education [employer].

## 2015-04-14 NOTE — Progress Notes (Signed)
   Subjective:    Patient ID: Terry HewsNancy B Babel, female    DOB: 11/01/1933, 81 y.o.   MRN: [id number]  HPI Here for medicare wellness, no new complaints. Please see A/P for status and treatment of chronic medical problems.   Diet: regular Physical activity: active Depression/mood screen: negative Hearing: intact to whispered voice Visual acuity: grossly normal, small cataracts, performs annual eye exam  ADLs: capable Fall risk: none Home safety: good Cognitive evaluation: intact to orientation, naming, recall and repetition EOL planning: adv directives discussed  I have personally reviewed and have noted 1. The patient's medical and social history - reviewed today no changes 2. Their use of alcohol, tobacco or illicit drugs 3. Their current medications and supplements 4. The patient's functional ability including ADL's, fall risks, home safety risks and hearing or visual impairment. 5. Diet and physical activities 6. Evidence for depression or mood disorders 7. Care team reviewed and updated (available in snapshot)  Review of Systems  Constitutional: Negative.   Respiratory: Negative.   Cardiovascular: Negative.   Gastrointestinal: Negative for nausea, vomiting, abdominal pain, diarrhea, constipation, blood in stool and abdominal distention.  Musculoskeletal: Negative.   Skin: Negative.   Neurological: Negative.   Psychiatric/Behavioral: Negative.       Objective:   Physical Exam  Constitutional: She is oriented to person, place, and time. She appears well-developed and well-nourished.  HENT:  Head: Normocephalic and atraumatic.  Eyes: EOM are normal.  Neck: Normal range of motion.  Cardiovascular: Normal rate and regular rhythm.   Pulmonary/Chest: Effort normal and breath sounds normal. No respiratory distress. She has no wheezes. She has no rales.  Abdominal: Soft. Bowel sounds are normal. She exhibits no distension. There is no tenderness. There is no rebound.    Neurological: She is alert and oriented to person, place, and time. Coordination normal.  Skin: Skin is warm and dry. No rash noted.   Filed Vitals:   04/14/15 0828  BP: 132/78  Pulse: 77  Temp: 98.3 F (36.8 C)  TempSrc: Oral  Resp: 14  Height: 5\' 5"  (1.651 m)  Weight: 126 lb 6.4 oz (57.335 kg)  SpO2: 98%      Assessment & Plan:  Pneumonia 23 given at visit and Tdap given at visit.

## 2015-04-14 NOTE — Assessment & Plan Note (Signed)
Tdap and pneumonia 23 given at visit. Checking labs, doing well. Exercises regularly with ping pong. No balance or memory problems and counseled on keeping her mind and body active as well as fall prevention. 10 year screening recommendations given at visit.

## 2015-04-14 NOTE — Progress Notes (Signed)
Pre visit review using our clinic review tool, if applicable. No additional management support is needed unless otherwise documented below in the visit note. 

## 2015-05-29 ENCOUNTER — Ambulatory Visit
Admission: RE | Admit: 2015-05-29 | Discharge: 2015-05-29 | Disposition: A | Discharge status: Home / Self Care | Payer: Medicare Other | Source: Ambulatory Visit

## 2015-05-29 DIAGNOSIS — Z1231 Encounter for screening mammogram for malignant neoplasm of breast: Secondary | ICD-10-CM

## 2015-07-11 DIAGNOSIS — H353212 Exudative age-related macular degeneration, right eye, with inactive choroidal neovascularization: Secondary | ICD-10-CM | POA: Diagnosis not present

## 2015-07-25 ENCOUNTER — Ambulatory Visit (INDEPENDENT_AMBULATORY_CARE_PROVIDER_SITE_OTHER): Payer: Medicare Other | Admitting: Nurse Practitioner

## 2015-07-25 ENCOUNTER — Encounter: Payer: Self-pay | Admitting: Nurse Practitioner

## 2015-07-25 VITALS — BP 132/76 | HR 79 | Temp 98.0°F | Resp 16 | Wt 130.0 lb

## 2015-07-25 DIAGNOSIS — R05 Cough: Secondary | ICD-10-CM

## 2015-07-25 DIAGNOSIS — R059 Cough, unspecified: Secondary | ICD-10-CM

## 2015-07-25 MED ORDER — GUAIFENESIN-CODEINE 100-10 MG/5ML PO SYRP: 5.0000 mL | ORAL_SOLUTION | Freq: Every day | ORAL | Status: DC

## 2015-07-25 NOTE — Assessment & Plan Note (Signed)
New onset Robitussin AC printed, signed, and given to pt to take to pharmacy Verbalized and wrote on AVS about how much to take, when to take, and drowsy side effects (pt verbalized understanding).  FU prn worsening/failure to improve.

## 2015-07-25 NOTE — Progress Notes (Signed)
Pre visit review using our clinic review tool, if applicable. No additional management support is needed unless otherwise documented below in the visit note. 

## 2015-07-25 NOTE — Patient Instructions (Signed)
5 mL (1 teaspoon) of cough syrup. Don't drive or make important decisions while taking this....just sleep and rest.    Thank you for the excellent information!

## 2015-07-25 NOTE — Progress Notes (Signed)
Patient ID: Terry Robbins, female    DOB: 1934-04-29  Age: 82 y.o. MRN: [id number]  CC: Cough   HPI DEROTHA FISHBAUGH presents for CC of cough x 3 days.   1) Dry cough x 3 days Poor sleep due to cough Robitussin AC in the past has been helpful  Denies ST, fevers, chills sweats or other viral symptoms  Treatment to date:  Equate brand cold/flu symptoms   Denies sick contacts  History Lakea has a past medical history of ARTHRITIS; MACULAR DEGENERATION; Empyema, left (HCC) (04/2010); OA (osteoarthritis); OSTEOPOROSIS; and Psoriasis.   She has past surgical history that includes VATS/Minithracotomy and bronch (04/2010); Ganglion cyst (L) thumb; and Mouth surgery.   Her family history includes Dementia in her other; Diabetes in her other; Hypertension in her other; Lung cancer in her other.She reports that she quit smoking about 38 years ago. Her smoking use included Cigarettes. She does not have any smokeless tobacco history on file. She reports that she does not drink alcohol or use illicit drugs.  Outpatient Prescriptions Prior to Visit  Medication Sig Dispense Refill  . Calcium Carbonate-Vitamin D (CALCIUM-D) 600-400 MG-UNIT TABS Take by mouth.    . Cholecalciferol (VITAMIN D-3) 1000 UNITS CAPS Take by mouth. Take 4800 units daily    . Lutein-Bilberry (LUTEIN PLUS BILBERRY PO) Take 20 mg by mouth daily.     . LUTEIN-ZEAXANTHIN PO Take by mouth daily.    . Multiple Vitamin (MULTIVITAMIN) tablet Take 1 tablet by mouth daily.      . raloxifene (EVISTA) 60 MG tablet Take 1 tablet (60 mg total) by mouth daily. 90 tablet 3   No facility-administered medications prior to visit.    ROS Review of Systems  Constitutional: Negative for fever, chills, diaphoresis and fatigue.  HENT: Positive for congestion and sore throat.   Respiratory: Positive for cough. Negative for chest tightness, shortness of breath and wheezing.   Cardiovascular: Negative for chest pain, palpitations and leg  swelling.  Gastrointestinal: Negative for nausea, vomiting and diarrhea.  Skin: Negative for rash.  Neurological: Negative for dizziness and headaches.    Objective:  BP 132/76 mmHg  Pulse 79  Temp(Src) 98 F (36.7 C) (Oral)  Resp 16  Wt 130 lb (58.968 kg)  SpO2 98%  Physical Exam  Constitutional: She is oriented to person, place, and time. She appears well-developed and well-nourished. No distress.  HENT:  Head: Normocephalic and atraumatic.  Right Ear: External ear normal.  Left Ear: External ear normal.  Cardiovascular: Normal rate, regular rhythm and normal heart sounds.  Exam reveals no gallop and no friction rub.   No murmur heard. Pulmonary/Chest: Effort normal and breath sounds normal. No respiratory distress. She has no wheezes. She has no rales. She exhibits no tenderness.  Neurological: She is alert and oriented to person, place, and time.  Skin: Skin is warm and dry. No rash noted. She is not diaphoretic.  Psychiatric: She has a normal mood and affect. Her behavior is normal. Judgment and thought content normal.   Assessment & Plan:   Narda was seen today for cough.  Diagnoses and all orders for this visit:  Cough  Other orders -     guaiFENesin-codeine (ROBITUSSIN AC) 100-10 MG/5ML syrup; Take 5 mLs by mouth at bedtime.  I am having Ms. Forde start on guaiFENesin-codeine. I am also having her maintain her multivitamin, LUTEIN-ZEAXANTHIN PO, Lutein-Bilberry (LUTEIN PLUS BILBERRY PO), raloxifene, Vitamin D-3, and Calcium-D.  Meds ordered this encounter  Medications  .  guaiFENesin-codeine (ROBITUSSIN AC) 100-10 MG/5ML syrup    Sig: Take 5 mLs by mouth at bedtime.    Dispense:  180 mL    Refill:  0    Order Specific Question:  Supervising Provider    Answer:  Sherlene Shams [[id number]     Follow-up: Return if symptoms worsen or fail to improve.

## 2015-08-01 ENCOUNTER — Ambulatory Visit (INDEPENDENT_AMBULATORY_CARE_PROVIDER_SITE_OTHER): Payer: Medicare Other | Admitting: Internal Medicine

## 2015-08-01 ENCOUNTER — Encounter: Payer: Self-pay | Admitting: Internal Medicine

## 2015-08-01 ENCOUNTER — Ambulatory Visit (INDEPENDENT_AMBULATORY_CARE_PROVIDER_SITE_OTHER)
Admission: RE | Admit: 2015-08-01 | Discharge: 2015-08-01 | Disposition: A | Discharge status: Home / Self Care | Payer: Medicare Other | Source: Ambulatory Visit | Attending: Internal Medicine | Admitting: Internal Medicine

## 2015-08-01 VITALS — BP 144/86 | HR 71 | Temp 98.1°F | Ht 65.0 in | Wt 126.0 lb

## 2015-08-01 DIAGNOSIS — R05 Cough: Secondary | ICD-10-CM | POA: Diagnosis not present

## 2015-08-01 DIAGNOSIS — R059 Cough, unspecified: Secondary | ICD-10-CM

## 2015-08-01 DIAGNOSIS — F411 Generalized anxiety disorder: Secondary | ICD-10-CM | POA: Diagnosis not present

## 2015-08-01 DIAGNOSIS — R079 Chest pain, unspecified: Secondary | ICD-10-CM | POA: Insufficient documentation

## 2015-08-01 NOTE — Assessment & Plan Note (Signed)
Overall improved, likely viral illness now improved, reassured, for delsym otc prn

## 2015-08-01 NOTE — Patient Instructions (Signed)
Please continue all other medications as before, and refills have been done if requested.  Please have the pharmacy call with any other refills you may need.  Please keep your appointments with your specialists as you may have planned  Please go to the XRAY Department in the Basement (go straight as you get off the elevator) for the x-ray testing  You will be contacted by phone if any changes need to be made immediately.  Otherwise, you will receive a letter about your results with an explanation, but please check with MyChart first.  Please remember to sign up for MyChart if you have not done so, as this will be important to you in the future with finding out test results, communicating by private email, and scheduling acute appointments online when needed.  

## 2015-08-01 NOTE — Assessment & Plan Note (Signed)
Chronic no change, post surgical related, for cxr, r/o pna but seems less likely with improved cough and likely viral illness symtpoms

## 2015-08-01 NOTE — Progress Notes (Signed)
Pre visit review using our clinic review tool, if applicable. No additional management support is needed unless otherwise documented below in the visit note. 

## 2015-08-01 NOTE — Progress Notes (Signed)
Subjective:    Patient ID: Terry Robbins, female    DOB: 01-02-1934, 82 y.o.   MRN: [id number]  HPI  Here to f/u, seen recently Feb 17 by NP, felt to have clinical dx of cough, likely viral, tx with cough med prn.  Since then overall pt with persistent dry cough though much less frequent and still non productive, no fever and overall feels improved, but very concerned she was seen be a NP, as well as the persistence of her left CP, admittedly no worse than mild intermittent chronic fleeting sharp, but just concerned she needed to see an MD, since she had a seroius illness requring surgury finally found by a chest surgeon 5 yrs ago, sounds like an empyema.  Pt denies fever, wt loss, night sweats, loss of appetite, or other constitutional symptoms   Pt denies other chest pain, increased sob or doe, wheezing, orthopnea, PND, increased LE swelling, palpitations, dizziness or syncope. Past Medical History  Diagnosis Date  . ARTHRITIS   . MACULAR DEGENERATION   . Empyema, left (HCC) 04/2010    related to PNA; s/p VATS/minithoracotomy & bronch  . OA (osteoarthritis)   . OSTEOPOROSIS   . Psoriasis    Past Surgical History  Procedure Laterality Date  . Vats/minithracotomy and bronch  04/2010  . Ganglion cyst (l) thumb    . Mouth surgery      reports that she quit smoking about 38 years ago. Her smoking use included Cigarettes. She does not have any smokeless tobacco history on file. She reports that she does not drink alcohol or use illicit drugs. family history includes Dementia in her other; Diabetes in her other; Hypertension in her other; Lung cancer in her other. Allergies  Allergen Reactions  . Cephalosporins Rash   Current Outpatient Prescriptions on File Prior to Visit  Medication Sig Dispense Refill  . Calcium Carbonate-Vitamin D (CALCIUM-D) 600-400 MG-UNIT TABS Take by mouth.    . Cholecalciferol (VITAMIN D-3) 1000 UNITS CAPS Take by mouth. Take 4800 units daily    .  guaiFENesin-codeine (ROBITUSSIN AC) 100-10 MG/5ML syrup Take 5 mLs by mouth at bedtime. 180 mL 0  . Lutein-Bilberry (LUTEIN PLUS BILBERRY PO) Take 20 mg by mouth daily.     . LUTEIN-ZEAXANTHIN PO Take by mouth daily.    . Multiple Vitamin (MULTIVITAMIN) tablet Take 1 tablet by mouth daily.      . raloxifene (EVISTA) 60 MG tablet Take 1 tablet (60 mg total) by mouth daily. 90 tablet 3   No current facility-administered medications on file prior to visit.   Review of Systems  Constitutional: Negative for unusual diaphoresis or night sweats HENT: Negative for ringing in ear or discharge Eyes: Negative for double vision or worsening visual disturbance.  Respiratory: Negative for choking and stridor.   Gastrointestinal: Negative for vomiting or other signifcant bowel change Genitourinary: Negative for hematuria or change in urine volume.  Musculoskeletal: Negative for other MSK pain or swelling Skin: Negative for color change and worsening wound.  Neurological: Negative for tremors and numbness other than noted  Psychiatric/Behavioral: Negative for decreased concentration or agitation other than above       Objective:   Physical Exam BP 144/86 mmHg  Pulse 71  Temp(Src) 98.1 F (36.7 C) (Oral)  Ht  (1.651 m)  Wt 126 lb (57.153 kg)  BMI 20.97 kg/m2  SpO2 96% VS noted,  Constitutional: Pt appears in no significant distress HENT: Head: NCAT.  Right Ear: External ear normal.  Left Ear: External ear normal.  Bilat tm's with mild erythema.  Max sinus areas non tender.  Pharynx with mild erythema, no exudate Eyes: . Pupils are equal, round, and reactive to light. Conjunctivae and EOM are normal Neck: Normal range of motion. Neck supple. with mild bilat < 1 cm few submandib LA Cardiovascular: Normal rate and regular rhythm.   Pulmonary/Chest: Effort normal and breath sounds with very few left dry rales, no wheezing.  Neurological: Pt is alert. Not confused , motor grossly  intact Skin: Skin is warm. No rash, no LE edema Psychiatric: Pt behavior is normal. No agitation. 1-2+ nervous, hyperverbal    Assessment & Plan:

## 2015-08-01 NOTE — Assessment & Plan Note (Signed)
Situational, ok to cont same tx, tried to reassure pt

## 2015-08-12 DIAGNOSIS — H353211 Exudative age-related macular degeneration, right eye, with active choroidal neovascularization: Secondary | ICD-10-CM | POA: Diagnosis not present

## 2015-08-12 DIAGNOSIS — H353122 Nonexudative age-related macular degeneration, left eye, intermediate dry stage: Secondary | ICD-10-CM | POA: Diagnosis not present

## 2015-08-19 ENCOUNTER — Other Ambulatory Visit: Payer: Self-pay | Admitting: Internal Medicine

## 2015-08-28 ENCOUNTER — Telehealth: Payer: Self-pay | Admitting: Internal Medicine

## 2015-08-28 NOTE — Telephone Encounter (Signed)
Patient states she has seen her opthalmologist and has found out that she has bleeding in her right eye from her cornia.  She is concerned on the side effects of raloxifene.  She would like to know if Dr. Okey Duprerawford thinks that this medication might have caused this issue.  She is requesting call back in regards.

## 2015-08-28 NOTE — Telephone Encounter (Signed)
Spoke with patient and she will get the notes from the eye doctor sent to us.

## 2015-08-28 NOTE — Telephone Encounter (Signed)
I would have to review the notes from the eye doctor to see what they are treating her for. Have the patient get them to send their notes to us.

## 2015-09-17 ENCOUNTER — Telehealth: Payer: Self-pay | Admitting: Internal Medicine

## 2015-09-17 NOTE — Telephone Encounter (Signed)
Fine to restart evista, thanks.

## 2015-09-17 NOTE — Telephone Encounter (Signed)
Patient would like to start taking avista again. She was concerned about blood clots in the eyes due to her wet macular degenration, but she has learned that the medication is not effecting her eye condition. She has the medication and is just letting us know. She is taking the generic form, raloxifene HCL 60 mg tablet daily.

## 2015-09-17 NOTE — Telephone Encounter (Signed)
States we should have received a letter from Dr. Duke Salviaandolph for injections for wet macular degeneration.  Spoke to Dr. Randon GoldsmithLyles (opthamologist) states that Francetta Foundavista does not have anything to do with condition she is having now.  States she can go back on this medication.  Please follow back up with patient.

## 2015-09-18 DIAGNOSIS — H353211 Exudative age-related macular degeneration, right eye, with active choroidal neovascularization: Secondary | ICD-10-CM | POA: Diagnosis not present

## 2015-10-01 ENCOUNTER — Telehealth: Payer: Self-pay | Admitting: Internal Medicine

## 2015-10-01 ENCOUNTER — Ambulatory Visit (INDEPENDENT_AMBULATORY_CARE_PROVIDER_SITE_OTHER): Payer: Medicare Other | Admitting: Family

## 2015-10-01 ENCOUNTER — Encounter: Payer: Self-pay | Admitting: Family

## 2015-10-01 VITALS — BP 130/80 | HR 66 | Temp 97.5°F | Ht 65.0 in | Wt 127.0 lb

## 2015-10-01 DIAGNOSIS — M6588 Other synovitis and tenosynovitis, other site: Secondary | ICD-10-CM

## 2015-10-01 DIAGNOSIS — M659 Synovitis and tenosynovitis, unspecified: Secondary | ICD-10-CM

## 2015-10-01 MED ORDER — DICLOFENAC SODIUM 1 % TD GEL: 4.0000 g | Freq: Four times a day (QID) | TRANSDERMAL | Status: DC

## 2015-10-01 NOTE — Progress Notes (Signed)
Pre visit review using our clinic review tool, if applicable. No additional management support is needed unless otherwise documented below in the visit note. 

## 2015-10-01 NOTE — Patient Instructions (Signed)
Suspect tenosynovitis, trial Voltaren gel. Alternate heat and ice.   If fails, we can do an injection with our sports medicine doctor- Dr. Katrinka BlazingSmith.   If there is no improvement in your symptoms, or if there is any worsening of symptoms, or if you have any additional concerns, please return for re-evaluation; or, if we are closed, consider going to the Emergency Room for evaluation if symptoms urgent.

## 2015-10-01 NOTE — Telephone Encounter (Signed)
Patient is requesting to schedule CPE and wellness together.  Please follow up with patient in regards.

## 2015-10-01 NOTE — Progress Notes (Signed)
Subjective:    Patient ID: Terry Robbins, female    DOB: 11/18/1933, 82 y.o.   MRN: [id number]   Terry Hewsancy B Werntz is a 82 y.o. female who presents today for an acute visit.    HPI Comments: Patient is here today for evaluation of right pinky finger which is new problem.Pain occurred after swatting at nat on computer. Bothers her when she types as cannot straighten finger out all of the way. Rates pain as mild. No catching sensation. Hasn't tried any medication for pain. Pain improves when she keeps her finger still.  No swelling of finger. H/o OA in feet  Per patient h/o trigger finger in left hand s/p steriod injection and surgery.     Past Medical History  Diagnosis Date  . ARTHRITIS   . MACULAR DEGENERATION   . Empyema, left (HCC) 04/2010    related to PNA; s/p VATS/minithoracotomy & bronch  . OA (osteoarthritis)   . OSTEOPOROSIS   . Psoriasis    Cephalosporins Current Outpatient Prescriptions on File Prior to Visit  Medication Sig Dispense Refill  . Calcium Carbonate-Vitamin D (CALCIUM-D) 600-400 MG-UNIT TABS Take by mouth.    . Cholecalciferol (VITAMIN D-3) 1000 UNITS CAPS Take by mouth. Take 4800 units daily    . guaiFENesin-codeine (ROBITUSSIN AC) 100-10 MG/5ML syrup Take 5 mLs by mouth at bedtime. 180 mL 0  . Lutein-Bilberry (LUTEIN PLUS BILBERRY PO) Take 20 mg by mouth daily.     . LUTEIN-ZEAXANTHIN PO Take by mouth daily.    . Multiple Vitamin (MULTIVITAMIN) tablet Take 1 tablet by mouth daily.      . raloxifene (EVISTA) 60 MG tablet take 1 tablet by mouth once daily 90 tablet 2   No current facility-administered medications on file prior to visit.    Social History  Substance Use Topics  . Smoking status: Former Smoker    Types: Cigarettes    Quit date: 06/07/1977  . Smokeless tobacco: None  . Alcohol Use: No    Review of Systems  Constitutional: Negative for fever and chills.  Musculoskeletal: Negative for joint swelling.  Skin: Negative for rash and  wound.      Objective:    Pulse 66  Temp(Src) 97.5 F (36.4 C) (Oral)  Ht 5\' 5"  (1.651 m)  Wt 127 lb (57.607 kg)  BMI 21.13 kg/m2  SpO2 97%   Physical Exam  Constitutional: She appears well-developed and well-nourished.  Eyes: Conjunctivae are normal.  Cardiovascular: Normal rate, regular rhythm, normal heart sounds and normal pulses.   Pulmonary/Chest: Effort normal and breath sounds normal. She has no wheezes. She has no rhonchi. She has no rales.  Musculoskeletal:       Right hand: She exhibits decreased range of motion. She exhibits no tenderness, no bony tenderness, normal capillary refill, no deformity, no laceration and no swelling. Normal sensation noted. Normal strength noted.  Right 5th finger:  No pain with palpation of DIP or PIP of right fifth finger. No Swelling, erythema. Skin intact. Able to flex and extend finger without pain. Mild reduction in ROM. Able to make a fist. No puckering or nodules noted over the flexor tendon proximal to right fifth finger.    Grip strength equal and symmetirc.     Neurological: She is alert.  Skin: Skin is warm and dry.  Psychiatric: She has a normal mood and affect. Her speech is normal and behavior is normal. Thought content normal.  Vitals reviewed.      Assessment &  Plan:  1. Tenosynovitis of finger Working diagnosis of tenosynovitis of right fifth finger after swatting incident. No pain/defmority to suggest fracture. Finger is not triggering. No clinical suspicion for dupuytren's contracture.  We agreed to treat conservatively at this time a topical NSAID gel. If this treatment fails, I recommended that she see Dr. Katrinka Blazing for possible corticosteroid injection.    - diclofenac sodium (VOLTAREN) 1 % GEL; Apply 4 g topically 4 (four) times daily.  Dispense: 1 Tube; Refill: 3   I am having Ms. Lukas maintain her multivitamin, LUTEIN-ZEAXANTHIN PO, Lutein-Bilberry (LUTEIN PLUS BILBERRY PO), Vitamin D-3, Calcium-D,  guaiFENesin-codeine, and raloxifene.   No orders of the defined types were placed in this encounter.     Start medications as prescribed and explained to patient on After Visit Summary ( AVS). Risks, benefits, and alternatives of the medications and treatment plan prescribed today were discussed, and patient expressed understanding.   Education regarding symptom management and diagnosis given to patient.   Follow-up:Plan follow-up as discussed or as needed if any worsening symptoms or change in condition. No Follow-up on file.   Continue to follow with Myrlene Broker, MD for routine health maintenance.   Terry Robbins and I agreed with plan.   Rennie Plowman, FNP

## 2015-10-16 DIAGNOSIS — H353211 Exudative age-related macular degeneration, right eye, with active choroidal neovascularization: Secondary | ICD-10-CM | POA: Diagnosis not present

## 2015-10-21 DIAGNOSIS — H1131 Conjunctival hemorrhage, right eye: Secondary | ICD-10-CM | POA: Diagnosis not present

## 2015-10-21 DIAGNOSIS — H2513 Age-related nuclear cataract, bilateral: Secondary | ICD-10-CM | POA: Diagnosis not present

## 2015-10-23 ENCOUNTER — Telehealth: Payer: Self-pay

## 2015-10-23 NOTE — Telephone Encounter (Signed)
Call to schedule AWV and agreed to come in 5/25 at 2:15 prior to 3pm fup with Dr. Okey Duprerawford to look  At her finger.  On my schedule and noted on apt notes/SH

## 2015-10-23 NOTE — Telephone Encounter (Signed)
[facility] her [facility] and CPE at different times. She will see Dr. Okey Duprerawford at 3pm (acute)  on the 25th and her AWV visit is scheduled prior to seeing Dr. Okey Duprerawford with me  Please call her and schedule her CPE p Nov 5th; she would like to come in early am for labs prior to seeing Dr. Okey Duprerawford. This is why she is calling in so early. Her number is [phone]  Tks, Oaklen Thiam

## 2015-10-30 ENCOUNTER — Ambulatory Visit (INDEPENDENT_AMBULATORY_CARE_PROVIDER_SITE_OTHER): Payer: Medicare Other | Admitting: Internal Medicine

## 2015-10-30 ENCOUNTER — Encounter: Payer: Self-pay | Admitting: Internal Medicine

## 2015-10-30 VITALS — BP 126/70 | HR 67 | Temp 97.8°F | Resp 16 | Ht 65.0 in | Wt 128.0 lb

## 2015-10-30 DIAGNOSIS — M542 Cervicalgia: Secondary | ICD-10-CM | POA: Diagnosis not present

## 2015-10-30 DIAGNOSIS — Z0000 Encounter for general adult medical examination without abnormal findings: Secondary | ICD-10-CM

## 2015-10-30 DIAGNOSIS — M25531 Pain in right wrist: Secondary | ICD-10-CM

## 2015-10-30 DIAGNOSIS — M79641 Pain in right hand: Secondary | ICD-10-CM

## 2015-10-30 NOTE — Progress Notes (Signed)
Subjective:   Terry Robbins is a 82 y.o. female who presents for Medicare Annual (Subsequent) preventive examination.  Review of Systems:  HRA assessment completed during visit; Terry Robbins, Terry Robbins  The Patient was informed that this wellness visit is to identify risk and educate on how to reduce risk for increase disease through lifestyle changes.   ROS deferred to CPE exam with physician completed recently Family and medical hx given below;    Lifestyle review:   OA; osteopenia; taking Vit D and calcium  Macular degeneration taking OTC Lutein; zeaxanthin; and Lutein with Bllberry Cannot take areds due to zinc in the drops  Labs; lipids 04/2015 cho 211; Trig 102; HDL 84; LDL 106; ratio 2 (O9GA1c 5.6)   Acute problem  To see Dr. Okey Duprerawford about right wrist; swatted fruit fly; left finger now is less mobile and she can feel pain around her wrist when she moves it. Claris CheMargaret told her to see Dr. Katrinka BlazingSmith and she is seeing Dr. Okey Duprerawford about ?PT    Psychosocial [place] nephews and nieces in [place]; going to see her No children; lives alone  Have "adopted a dtr and her 2 children" that were neighbors; [address] She provides family atmosphere;   Tobacco: no (history x 20 years ) quit over 30 years ago  ETOH no   Medication review/ Adherent; Calcium 1200 and Vit D around 4800 per day Calcium 600mg  with Vit d 400 mg x 2 and Vit D 2000 x 2  Po Tolerate well  BMI: 22  Diet;  adequate  Exercise;   [facility];    HOME SAFETY;  No falls;  Removal of clutter clearing paths through the home,   Railing as needed discussed   Community safety; YES Smoke detectors YES  Stressors (1-5) no stressors  Depression: Denies feeling depressed or hopeless; voices pleasure in daily life  Goal; to keep eyes in good shape  Cognitive; Denies memory issues; Ad8 score 0   Fall assessment no then said one fall playing ping pong, did not hit head and  was not injured. Gait assessment appears normal;   Mobilization and Functional losses from last year to this year? No; very active and engaged in the community   Sleep pattern changes; no  Urinary or fecal incontinence reviewed: no  Counseling Health Maintenance [facility];   EKG: 05/2013 Mammogram: 05/2015 neg/ 05/2016 Dexa/ 11/192015; Due 12/ 2017  -1,5 left femur  Doing weight bearing and Vit D  EXB:[id number]:deferred Hearing: wears hearing aids   Ophthalmology exam; now has wet MD in OD; Left OS is clearing up and is good now;  The Lutein-Zeaxanthin is helping;  [facility] ophthalmology Dr. Randon GoldsmithLyles   Immunizations Due: had the shingles shot in the past at drug store; added to historical but estimated date   Advanced Directive; yes; nephew [facility]   Health Recommendations and Referrals  Barriers to Success None noted; the patient is actively engaged in her health    Current Care Team reviewed and updated  Cardiac Risk Factors include: advanced age (>4155men, 42>65 women)     Objective:     Vitals: BP 126/70 mmHg  Pulse 67  Temp(Src) 97.8 F (36.6 C) (Oral)  Resp 16  Ht 5\' 5"  (1.651 m)  Wt 128 lb (58.06 kg)  BMI 21.30 kg/m2  SpO2 97%  Body mass index is 21.3 kg/(m^2).   Tobacco History  Smoking status  .  Former Smoker -- 1.00 packs/day for 20 years  . Types: Cigarettes  . Quit date: 06/07/1977  Smokeless tobacco  . Not on file     Counseling given: Yes   Past Medical History  Diagnosis Date  . ARTHRITIS   . MACULAR DEGENERATION   . Empyema, left (HCC) 04/2010    related to PNA; s/p VATS/minithoracotomy & bronch  . OA (osteoarthritis)   . OSTEOPOROSIS   . Psoriasis    Past Surgical History  Procedure Laterality Date  . Vats/minithracotomy and bronch  04/2010  . Ganglion cyst (l) thumb    . Mouth surgery     Family History  Problem Relation Age of Onset  . Diabetes Other     Parent  . Hypertension Other     parent  . Dementia  Other     Parent  . Lung cancer Other     Parent   History  Sexual Activity  . Sexual Activity: Not on file    Outpatient Encounter Prescriptions as of 10/30/2015  Medication Sig  . Calcium Carbonate-Vitamin D (CALCIUM-D) 600-400 MG-UNIT TABS Take by mouth.  . Cholecalciferol (VITAMIN D-3) 1000 UNITS CAPS Take by mouth. Take 4800 units daily  . Lutein-Bilberry (LUTEIN PLUS BILBERRY PO) Take 20 mg by mouth daily.   . LUTEIN-ZEAXANTHIN PO Take by mouth daily.  . Multiple Vitamin (MULTIVITAMIN) tablet Take 1 tablet by mouth daily.    . raloxifene (EVISTA) 60 MG tablet take 1 tablet by mouth once daily  . diclofenac sodium (VOLTAREN) 1 % GEL Apply 4 g topically 4 (four) times daily. (Patient not taking: Reported on 10/30/2015)  . guaiFENesin-codeine (ROBITUSSIN AC) 100-10 MG/5ML syrup Take 5 mLs by mouth at bedtime. (Patient not taking: Reported on 10/30/2015)   No facility-administered encounter medications on file as of 10/30/2015.    Activities of Daily Living In your present state of health, do you have any difficulty performing the following activities: 10/30/2015  Hearing? Y  Vision? Y  Difficulty concentrating or making decisions? N  Walking or climbing stairs? N  Dressing or bathing? N  Doing errands, shopping? N  Preparing Food and eating ? N  Using the Toilet? N  In the past six months, have you accidently leaked urine? N  Do you have problems with loss of bowel control? N  Managing your Medications? N  Managing your Finances? N  Housekeeping or managing your Housekeeping? N    Patient Care Team: Myrlene Broker, MD as PCP - General (Internal Medicine) Maris Berger, MD as Consulting Physician (Ophthalmology) Carman Ching, MD (Gastroenterology)    Assessment:   Exercise Activities and Dietary recommendations Current Exercise Habits: Structured exercise class, Type of exercise: walking;Other - see comments (ping pong; ), Time (Minutes): > 60, Frequency  (Times/Week): 3, Weekly Exercise (Minutes/Week): 0, Intensity: Moderate  Goals    . patient     Taking care of your eyes       Fall Risk Fall Risk  10/30/2015 04/14/2015 04/10/2014 05/21/2013  Falls in the past year? Yes No No No  Number falls in past yr: 1 - - -  Injury with Fall? No - - -  Follow up (No Data) - - -   Depression Screen PHQ 2/9 Scores 10/30/2015 04/14/2015 04/10/2014 05/21/2013  PHQ - 2 Score 0 0 0 0     Cognitive Testing MMSE - Mini Mental State Exam 10/30/2015  Not completed: (No Data)    Immunization History  Administered Date(s) Administered  .  Influenza Split 03/25/2011, 03/10/2012  . Influenza, High Dose Seasonal PF 04/05/2013, 04/10/2014, 03/13/2015  . Pneumococcal Conjugate-13 05/21/2013  . Pneumococcal Polysaccharide-23 04/14/2015  . Tdap 04/14/2015  . Zoster 06/07/2004   Screening Tests Health Maintenance  Topic Date Due  . INFLUENZA VACCINE  01/06/2016  . TETANUS/TDAP  04/13/2025  . DEXA SCAN  Completed  . ZOSTAVAX  Addressed  . PNA vac Low Risk Adult  Completed      Plan:    During the course of the visit the patient was educated and counseled about the following appropriate screening and preventive services:   Vaccines to include Pneumoccal, Influenza, Hepatitis B, Td, Zostavax, HCV  None due  Electrocardiogram: 05/2013  Cardiovascular Disease/ BP good; remote hx of smoking   Colorectal cancer screening/ followed by Deboraha Sprang; may repeat 01/2017  Bone density screening/ ? Due 05/2016 if she repeats; on Evista  Diabetes screening.neg  Glaucoma screening/ no but has mac deg and under tx; Wet in OD and dry in OS;   Mammography/ annual 12/.2017  Nutrition counseling ; very good; no issues   Patient Instructions (the written plan) was given to the patient.   Montine Circle, RN  10/30/2015

## 2015-10-30 NOTE — Patient Instructions (Addendum)
  Ms. Terry Robbins , Thank you for taking time to come for your Medicare Wellness Visit. I appreciate your ongoing commitment to your health goals. Please review the following plan we discussed and let me know if I can assist you in the future.   Keep exercising!   These are the goals we discussed: Goals    . patient     Taking care of your eyes        This is a list of the screening recommended for you and due dates:  Health Maintenance  Topic Date Due  . Flu Shot  01/06/2016  . Tetanus Vaccine  04/13/2025  . DEXA scan (bone density measurement)  Completed  . Shingles Vaccine  Addressed  . Pneumonia vaccines  Completed

## 2015-10-30 NOTE — Progress Notes (Signed)
   Subjective:    Patient ID: Terry Robbins, female    DOB: 11/09/1933, 82 y.o.   MRN: [id number]  HPI The patient is an 82 YO female coming in for right wrist pain. Going on for about 1-2 months and overall stable to mildly improving. Taking tylenol for pain which is fairly effective. No weakness but occasional numbness. This is not her dominant hand.   Having some eye difficulties and getting 3 different answers from people at the same office. Getting injections and having no real change in her vision but the scan of her eye was looking better.   Review of Systems  Constitutional: Negative.   Respiratory: Negative.   Cardiovascular: Negative.   Gastrointestinal: Negative for nausea, vomiting, abdominal pain, diarrhea, constipation, blood in stool and abdominal distention.  Musculoskeletal: Positive for arthralgias.  Skin: Negative.   Neurological: Negative.   Psychiatric/Behavioral: Negative.       Objective:   Physical Exam  Constitutional: She is oriented to person, place, and time. She appears well-developed and well-nourished.  HENT:  Head: Normocephalic and atraumatic.  Eyes: EOM are normal.  Neck: Normal range of motion.  Cardiovascular: Normal rate and regular rhythm.   Pulmonary/Chest: Effort normal and breath sounds normal. No respiratory distress. She has no wheezes. She has no rales.  Abdominal: Soft. She exhibits no distension. There is no tenderness. There is no rebound.  Musculoskeletal:  Mild tenderness in the right wrist, full ROM, no pain in the shoulder or back or neck right  Neurological: She is alert and oriented to person, place, and time. Coordination normal.  Skin: Skin is warm and dry. No rash noted.   Filed Vitals:   10/30/15 1423  BP: 126/70  Pulse: 67  Temp: 97.8 F (36.6 C)  TempSrc: Oral  Resp: 16  Height: 5\' 5"  (1.651 m)  Weight: 128 lb (58.06 kg)  SpO2: 97%      Assessment & Plan:

## 2015-10-30 NOTE — Progress Notes (Signed)
Pre visit review using our clinic review tool, if applicable. No additional management support is needed unless otherwise documented below in the visit note. 

## 2015-11-01 DIAGNOSIS — M25531 Pain in right wrist: Secondary | ICD-10-CM | POA: Insufficient documentation

## 2015-11-01 NOTE — Progress Notes (Signed)
Medical screening examination/treatment/procedure(s) were performed by non-physician practitioner and as supervising physician I was immediately available for consultation/collaboration. I agree with above. Itamar Mcgowan A Soni Kegel, MD 

## 2015-11-01 NOTE — Assessment & Plan Note (Addendum)
Suspect some mild carpal tunnel with her intermittent numbness and pain. Talked to her about brace for night time use and given information. No indication for imaging today. Reviewed last labs and no electrolyte abnormality. Referral to PT for some relief.

## 2015-11-12 DIAGNOSIS — M66841 Spontaneous rupture of other tendons, right hand: Secondary | ICD-10-CM | POA: Diagnosis not present

## 2015-11-13 DIAGNOSIS — H353211 Exudative age-related macular degeneration, right eye, with active choroidal neovascularization: Secondary | ICD-10-CM | POA: Diagnosis not present

## 2015-11-24 ENCOUNTER — Telehealth: Payer: Self-pay | Admitting: Internal Medicine

## 2015-11-24 NOTE — Telephone Encounter (Signed)
Please give the patient a call regarding a call she got asking if she has had any PT.

## 2015-11-26 NOTE — Telephone Encounter (Signed)
Patient saw Dr. Janee Mornhompson and he did not say that the patient needed PT. The patient is still having problems with her pinky finger. She said she is managing with it though. She is being careful with the wrist and the finger.

## 2015-12-15 ENCOUNTER — Telehealth: Payer: Self-pay

## 2015-12-15 DIAGNOSIS — M25531 Pain in right wrist: Secondary | ICD-10-CM

## 2015-12-15 NOTE — Telephone Encounter (Signed)
Referral placed.

## 2015-12-15 NOTE — Telephone Encounter (Signed)
Patient called and wants us to call and the rx for PT with [phone] Aris EvertsDora Cetrone. She states Dr. Okey Duprerawford should already know all the reasons why she want this?? Please follow up, Thank you.

## 2015-12-15 NOTE — Telephone Encounter (Signed)
Error

## 2015-12-15 NOTE — Telephone Encounter (Signed)
Patient aware.

## 2015-12-25 DIAGNOSIS — H353211 Exudative age-related macular degeneration, right eye, with active choroidal neovascularization: Secondary | ICD-10-CM | POA: Diagnosis not present

## 2015-12-29 DIAGNOSIS — M25531 Pain in right wrist: Secondary | ICD-10-CM | POA: Diagnosis not present

## 2016-01-05 ENCOUNTER — Telehealth: Payer: Self-pay | Admitting: Internal Medicine

## 2016-01-05 DIAGNOSIS — M542 Cervicalgia: Secondary | ICD-10-CM

## 2016-01-05 NOTE — Telephone Encounter (Signed)
Please advise, thanks.

## 2016-01-05 NOTE — Telephone Encounter (Signed)
Placed order

## 2016-01-05 NOTE — Telephone Encounter (Signed)
Terry Robbins called from [facility] wants to see if Dr. Okey Dupre will fax order PT for her neck (they had order to treat her wrist but pt was thought Dr. Okey Dupre put in order for both). Please advise, fax # [phone]

## 2016-01-08 DIAGNOSIS — M25561 Pain in right knee: Secondary | ICD-10-CM | POA: Diagnosis not present

## 2016-01-08 NOTE — Telephone Encounter (Signed)
Faxed to number provided

## 2016-01-08 NOTE — Telephone Encounter (Signed)
[facility] called and patientt request an additional neck intervention and custom left thumb split for [facility] joint. Pt has an appt today 8/3 at 1040 am so they were hoping to get this faxed over before her appt. Fax #: [phone]

## 2016-01-08 NOTE — Telephone Encounter (Signed)
Rx written and on your desk. Please fax.

## 2016-01-14 DIAGNOSIS — M25531 Pain in right wrist: Secondary | ICD-10-CM | POA: Diagnosis not present

## 2016-01-22 DIAGNOSIS — H353122 Nonexudative age-related macular degeneration, left eye, intermediate dry stage: Secondary | ICD-10-CM | POA: Diagnosis not present

## 2016-01-22 DIAGNOSIS — H353211 Exudative age-related macular degeneration, right eye, with active choroidal neovascularization: Secondary | ICD-10-CM | POA: Diagnosis not present

## 2016-01-23 DIAGNOSIS — M25531 Pain in right wrist: Secondary | ICD-10-CM | POA: Diagnosis not present

## 2016-01-29 DIAGNOSIS — M25531 Pain in right wrist: Secondary | ICD-10-CM | POA: Diagnosis not present

## 2016-02-05 DIAGNOSIS — M25531 Pain in right wrist: Secondary | ICD-10-CM | POA: Diagnosis not present

## 2016-02-12 DIAGNOSIS — M25531 Pain in right wrist: Secondary | ICD-10-CM | POA: Diagnosis not present

## 2016-02-19 DIAGNOSIS — H353211 Exudative age-related macular degeneration, right eye, with active choroidal neovascularization: Secondary | ICD-10-CM | POA: Diagnosis not present

## 2016-02-23 DIAGNOSIS — M25531 Pain in right wrist: Secondary | ICD-10-CM | POA: Diagnosis not present

## 2016-03-03 DIAGNOSIS — M25531 Pain in right wrist: Secondary | ICD-10-CM | POA: Diagnosis not present

## 2016-03-12 DIAGNOSIS — M25531 Pain in right wrist: Secondary | ICD-10-CM | POA: Diagnosis not present

## 2016-03-18 DIAGNOSIS — H353211 Exudative age-related macular degeneration, right eye, with active choroidal neovascularization: Secondary | ICD-10-CM | POA: Diagnosis not present

## 2016-03-22 DIAGNOSIS — M25531 Pain in right wrist: Secondary | ICD-10-CM | POA: Diagnosis not present

## 2016-03-29 DIAGNOSIS — M25531 Pain in right wrist: Secondary | ICD-10-CM | POA: Diagnosis not present

## 2016-04-10 ENCOUNTER — Ambulatory Visit (INDEPENDENT_AMBULATORY_CARE_PROVIDER_SITE_OTHER): Payer: Medicare Other

## 2016-04-10 DIAGNOSIS — Z23 Encounter for immunization: Secondary | ICD-10-CM

## 2016-04-16 ENCOUNTER — Ambulatory Visit (INDEPENDENT_AMBULATORY_CARE_PROVIDER_SITE_OTHER): Payer: Medicare Other | Admitting: Internal Medicine

## 2016-04-16 ENCOUNTER — Other Ambulatory Visit (INDEPENDENT_AMBULATORY_CARE_PROVIDER_SITE_OTHER): Payer: Medicare Other

## 2016-04-16 ENCOUNTER — Encounter: Payer: Self-pay | Admitting: Internal Medicine

## 2016-04-16 VITALS — BP 132/72 | HR 67 | Temp 97.9°F | Resp 14 | Ht 65.0 in | Wt 126.0 lb

## 2016-04-16 DIAGNOSIS — Z0000 Encounter for general adult medical examination without abnormal findings: Secondary | ICD-10-CM

## 2016-04-16 DIAGNOSIS — M150 Primary generalized (osteo)arthritis: Secondary | ICD-10-CM | POA: Diagnosis not present

## 2016-04-16 DIAGNOSIS — M159 Polyosteoarthritis, unspecified: Secondary | ICD-10-CM

## 2016-04-16 LAB — LIPID PANEL
Cholesterol: 198 mg/dL (ref 0–200)
HDL: 85.2 mg/dL (ref >39.00)
LDL Cholesterol: 98 mg/dL (ref 0–99)
NonHDL: 113.18
Total CHOL/HDL Ratio: 2
Triglycerides: 77 mg/dL (ref 0.0–149.0)
VLDL: 15.4 mg/dL (ref 0.0–40.0)

## 2016-04-16 LAB — URINALYSIS, ROUTINE W REFLEX MICROSCOPIC
Bilirubin Urine: NEGATIVE
HGB URINE DIPSTICK: NEGATIVE
Ketones, ur: NEGATIVE
Leukocytes, UA: NEGATIVE
Nitrite: NEGATIVE
Specific Gravity, Urine: 1.025 (ref 1.000–1.030)
Total Protein, Urine: NEGATIVE
UROBILINOGEN UA: 0.2 (ref 0.0–1.0)
Urine Glucose: NEGATIVE
pH: 5 (ref 5.0–8.0)

## 2016-04-16 LAB — COMPREHENSIVE METABOLIC PANEL
ALK PHOS: 52 U/L (ref 39–117)
ALT: 11 U/L (ref 0–35)
AST: 18 U/L (ref 0–37)
Albumin: 4.1 g/dL (ref 3.5–5.2)
BUN: 24 mg/dL — ABNORMAL HIGH (ref 6–23)
CO2: 31 mEq/L (ref 19–32)
Calcium: 9.5 mg/dL (ref 8.4–10.5)
Chloride: 105 mEq/L (ref 96–112)
Creatinine, Ser: 0.96 mg/dL (ref 0.40–1.20)
GFR: 59.36 mL/min — ABNORMAL LOW (ref >60.00)
GLUCOSE: 97 mg/dL (ref 70–99)
POTASSIUM: 4.2 meq/L (ref 3.5–5.1)
Sodium: 143 mEq/L (ref 135–145)
TOTAL PROTEIN: 6.8 g/dL (ref 6.0–8.3)
Total Bilirubin: 0.6 mg/dL (ref 0.2–1.2)

## 2016-04-16 LAB — CBC
HEMATOCRIT: 41.9 % (ref 36.0–46.0)
Hemoglobin: 14.3 g/dL (ref 12.0–15.0)
MCHC: 34 g/dL (ref 30.0–36.0)
MCV: 88.5 fl (ref 78.0–100.0)
Platelets: 234 10*3/uL (ref 150.0–400.0)
RBC: 4.74 Mil/uL (ref 3.87–5.11)
RDW: 13.6 % (ref 11.5–15.5)
WBC: 6.2 10*3/uL (ref 4.0–10.5)

## 2016-04-16 LAB — VITAMIN D 25 HYDROXY (VIT D DEFICIENCY, FRACTURES): VITD: 85.37 ng/mL (ref 30.00–100.00)

## 2016-04-16 NOTE — Patient Instructions (Signed)
We are checking the labs today and will send the results on mychart.   Health Maintenance, Female Adopting a healthy lifestyle and getting preventive care can go a long way to promote health and wellness. Talk with your health care provider about what schedule of regular examinations is right for you. This is a good chance for you to check in with your provider about disease prevention and staying healthy. In between checkups, there are plenty of things you can do on your own. Experts have done a lot of research about which lifestyle changes and preventive measures are most likely to keep you healthy. Ask your health care provider for more information. WEIGHT AND DIET  Eat a healthy diet  Be sure to include plenty of vegetables, fruits, low-fat dairy products, and lean protein.  Do not eat a lot of foods high in solid fats, added sugars, or salt.  Get regular exercise. This is one of the most important things you can do for your health.  Most adults should exercise for at least 150 minutes each week. The exercise should increase your heart rate and make you sweat (moderate-intensity exercise).  Most adults should also do strengthening exercises at least twice a week. This is in addition to the moderate-intensity exercise.  Maintain a healthy weight  Body mass index (BMI) is a measurement that can be used to identify possible weight problems. It estimates body fat based on height and weight. Your health care provider can help determine your BMI and help you achieve or maintain a healthy weight.  For females 12 years of age and older:   A BMI below 18.5 is considered underweight.  A BMI of 18.5 to 24.9 is normal.  A BMI of 25 to 29.9 is considered overweight.  A BMI of 30 and above is considered obese.  Watch levels of cholesterol and blood lipids  You should start having your blood tested for lipids and cholesterol at 80 years of age, then have this test every 5 years.  You may  need to have your cholesterol levels checked more often if:  Your lipid or cholesterol levels are high.  You are older than 80 years of age.  You are at high risk for heart disease.  CANCER SCREENING   Lung Cancer  Lung cancer screening is recommended for adults 27-19 years old who are at high risk for lung cancer because of a history of smoking.  A yearly low-dose CT scan of the lungs is recommended for people who:  Currently smoke.  Have quit within the past 15 years.  Have at least a 30-pack-year history of smoking. A pack year is smoking an average of one pack of cigarettes a day for 1 year.  Yearly screening should continue until it has been 15 years since you quit.  Yearly screening should stop if you develop a health problem that would prevent you from having lung cancer treatment.  Breast Cancer  Practice breast self-awareness. This means understanding how your breasts normally appear and feel.  It also means doing regular breast self-exams. Let your health care provider know about any changes, no matter how small.  If you are in your 20s or 30s, you should have a clinical breast exam (CBE) by a health care provider every 1-3 years as part of a regular health exam.  If you are 48 or older, have a CBE every year. Also consider having a breast X-ray (mammogram) every year.  If you have a family history  of breast cancer, talk to your health care provider about genetic screening.  If you are at high risk for breast cancer, talk to your health care provider about having an MRI and a mammogram every year.  Breast cancer gene (BRCA) assessment is recommended for women who have family members with BRCA-related cancers. BRCA-related cancers include:  Breast.  Ovarian.  Tubal.  Peritoneal cancers.  Results of the assessment will determine the need for genetic counseling and BRCA1 and BRCA2 testing. Cervical Cancer Your health care provider may recommend that you be  screened regularly for cancer of the pelvic organs (ovaries, uterus, and vagina). This screening involves a pelvic examination, including checking for microscopic changes to the surface of your cervix (Pap test). You may be encouraged to have this screening done every 3 years, beginning at age 39.  For women ages 39-65, health care providers may recommend pelvic exams and Pap testing every 3 years, or they may recommend the Pap and pelvic exam, combined with testing for human papilloma virus (HPV), every 5 years. Some types of HPV increase your risk of cervical cancer. Testing for HPV may also be done on women of any age with unclear Pap test results.  Other health care providers may not recommend any screening for nonpregnant women who are considered low risk for pelvic cancer and who do not have symptoms. Ask your health care provider if a screening pelvic exam is right for you.  If you have had past treatment for cervical cancer or a condition that could lead to cancer, you need Pap tests and screening for cancer for at least 20 years after your treatment. If Pap tests have been discontinued, your risk factors (such as having a new sexual partner) need to be reassessed to determine if screening should resume. Some women have medical problems that increase the chance of getting cervical cancer. In these cases, your health care provider may recommend more frequent screening and Pap tests. Colorectal Cancer  This type of cancer can be detected and often prevented.  Routine colorectal cancer screening usually begins at 80 years of age and continues through 80 years of age.  Your health care provider may recommend screening at an earlier age if you have risk factors for colon cancer.  Your health care provider may also recommend using home test kits to check for hidden blood in the stool.  A small camera at the end of a tube can be used to examine your colon directly (sigmoidoscopy or colonoscopy).  This is done to check for the earliest forms of colorectal cancer.  Routine screening usually begins at age 70.  Direct examination of the colon should be repeated every 5-10 years through 80 years of age. However, you may need to be screened more often if early forms of precancerous polyps or small growths are found. Skin Cancer  Check your skin from head to toe regularly.  Tell your health care provider about any new moles or changes in moles, especially if there is a change in a mole's shape or color.  Also tell your health care provider if you have a mole that is larger than the size of a pencil eraser.  Always use sunscreen. Apply sunscreen liberally and repeatedly throughout the day.  Protect yourself by wearing long sleeves, pants, a wide-brimmed hat, and sunglasses whenever you are outside. HEART DISEASE, DIABETES, AND HIGH BLOOD PRESSURE   High blood pressure causes heart disease and increases the risk of stroke. High blood pressure is  more likely to develop in:  People who have blood pressure in the high end of the normal range (130-139/85-89 mm Hg).  People who are overweight or obese.  People who are African American.  If you are 11-85 years of age, have your blood pressure checked every 3-5 years. If you are 84 years of age or older, have your blood pressure checked every year. You should have your blood pressure measured twice--once when you are at a hospital or clinic, and once when you are not at a hospital or clinic. Record the average of the two measurements. To check your blood pressure when you are not at a hospital or clinic, you can use:  An automated blood pressure machine at a pharmacy.  A home blood pressure monitor.  If you are between 23 years and 12 years old, ask your health care provider if you should take aspirin to prevent strokes.  Have regular diabetes screenings. This involves taking a blood sample to check your fasting blood sugar level.  If you  are at a normal weight and have a low risk for diabetes, have this test once every three years after 80 years of age.  If you are overweight and have a high risk for diabetes, consider being tested at a younger age or more often. PREVENTING INFECTION  Hepatitis B  If you have a higher risk for hepatitis B, you should be screened for this virus. You are considered at high risk for hepatitis B if:  You were born in a country where hepatitis B is common. Ask your health care provider which countries are considered high risk.  Your parents were born in a high-risk country, and you have not been immunized against hepatitis B (hepatitis B vaccine).  You have HIV or AIDS.  You use needles to inject street drugs.  You live with someone who has hepatitis B.  You have had sex with someone who has hepatitis B.  You get hemodialysis treatment.  You take certain medicines for conditions, including cancer, organ transplantation, and autoimmune conditions. Hepatitis C  Blood testing is recommended for:  Everyone born from [birth year] through 1965.  Anyone with known risk factors for hepatitis C. Sexually transmitted infections (STIs)  You should be screened for sexually transmitted infections (STIs) including gonorrhea and chlamydia if:  You are sexually active and are younger than 80 years of age.  You are older than 80 years of age and your health care provider tells you that you are at risk for this type of infection.  Your sexual activity has changed since you were last screened and you are at an increased risk for chlamydia or gonorrhea. Ask your health care provider if you are at risk.  If you do not have HIV, but are at risk, it may be recommended that you take a prescription medicine daily to prevent HIV infection. This is called pre-exposure prophylaxis (PrEP). You are considered at risk if:  You are sexually active and do not regularly use condoms or know the HIV status of your  partner(s).  You take drugs by injection.  You are sexually active with a partner who has HIV. Talk with your health care provider about whether you are at high risk of being infected with HIV. If you choose to begin PrEP, you should first be tested for HIV. You should then be tested every 3 months for as long as you are taking PrEP.  PREGNANCY   If you are premenopausal and you  may become pregnant, ask your health care provider about preconception counseling.  If you may become pregnant, take 400 to 800 micrograms (mcg) of folic acid every day.  If you want to prevent pregnancy, talk to your health care provider about birth control (contraception). OSTEOPOROSIS AND MENOPAUSE   Osteoporosis is a disease in which the bones lose minerals and strength with aging. This can result in serious bone fractures. Your risk for osteoporosis can be identified using a bone density scan.  If you are 75 years of age or older, or if you are at risk for osteoporosis and fractures, ask your health care provider if you should be screened.  Ask your health care provider whether you should take a calcium or vitamin D supplement to lower your risk for osteoporosis.  Menopause may have certain physical symptoms and risks.  Hormone replacement therapy may reduce some of these symptoms and risks. Talk to your health care provider about whether hormone replacement therapy is right for you.  HOME CARE INSTRUCTIONS   Schedule regular health, dental, and eye exams.  Stay current with your immunizations.   Do not use any tobacco products including cigarettes, chewing tobacco, or electronic cigarettes.  If you are pregnant, do not drink alcohol.  If you are breastfeeding, limit how much and how often you drink alcohol.  Limit alcohol intake to no more than 1 drink per day for nonpregnant women. One drink equals 12 ounces of beer, 5 ounces of wine, or 1 ounces of hard liquor.  Do not use street drugs.  Do  not share needles.  Ask your health care provider for help if you need support or information about quitting drugs.  Tell your health care provider if you often feel depressed.  Tell your health care provider if you have ever been abused or do not feel safe at home.   This information is not intended to replace advice given to you by your health care provider. Make sure you discuss any questions you have with your health care provider.   Document Released: 12/07/2010 Document Revised: 06/14/2014 Document Reviewed: 04/25/2013 Elsevier Interactive Patient Education [employer].

## 2016-04-16 NOTE — Progress Notes (Signed)
Pre visit review using our clinic review tool, if applicable. No additional management support is needed unless otherwise documented below in the visit note. 

## 2016-04-16 NOTE — Progress Notes (Signed)
   Subjective:    Patient ID: Terry HewsNancy B Troupe, female    DOB: 02/08/1934, 82 y.o.   MRN: [id number]  HPI Here for physical. No new concerns.  [facility], social history reviewed and updated.   Review of Systems  Constitutional: Negative.   HENT: Negative.   Eyes: Negative.   Respiratory: Negative for cough, chest tightness and shortness of breath.   Cardiovascular: Negative for chest pain, palpitations and leg swelling.  Gastrointestinal: Negative for abdominal distention, abdominal pain, constipation, diarrhea, nausea and vomiting.  Musculoskeletal: Negative.   Skin: Negative.   Neurological: Negative.   Psychiatric/Behavioral: Negative.       Objective:   Physical Exam  Constitutional: She is oriented to person, place, and time. She appears well-developed and well-nourished.  HENT:  Head: Normocephalic and atraumatic.  Hearing aids  Eyes: EOM are normal.  Neck: Normal range of motion.  Cardiovascular: Normal rate and regular rhythm.   Carotids without bruit  Pulmonary/Chest: Effort normal and breath sounds normal. No respiratory distress. She has no wheezes. She has no rales.  Abdominal: Soft. Bowel sounds are normal. She exhibits no distension. There is no tenderness. There is no rebound.  Musculoskeletal: She exhibits no edema.  Neurological: She is alert and oriented to person, place, and time. Coordination normal.  Skin: Skin is warm and dry.  Psychiatric: She has a normal mood and affect.   Vitals:   04/16/16 0812  BP: 132/72  Pulse: 67  Resp: 14  Temp: 97.9 F (36.6 C)  TempSrc: Oral  SpO2: 98%  Weight: 126 lb (57.2 kg)  Height: 5\' 5"  (1.651 m)      Assessment & Plan:

## 2016-04-17 NOTE — Assessment & Plan Note (Signed)
Renew PT as it is helping her significantly.

## 2016-04-17 NOTE — Assessment & Plan Note (Signed)
Has aged out of colonoscopy and mammogram up to date, tetanus and flu shot up to date. Shingles done. Pneumonia series completed. Counseled on sun safety and mole exam done today. Counseled on dangers of distracted driving and home safety. Given screening recommendations.

## 2016-05-06 ENCOUNTER — Other Ambulatory Visit: Payer: Self-pay | Admitting: Internal Medicine

## 2016-05-06 DIAGNOSIS — Z1231 Encounter for screening mammogram for malignant neoplasm of breast: Secondary | ICD-10-CM

## 2016-06-09 ENCOUNTER — Telehealth: Payer: Self-pay | Admitting: Internal Medicine

## 2016-06-09 NOTE — Telephone Encounter (Signed)
Pt came by and brought info from  Dora her physical therapist .  [facility]  She would like for Dr Okey Duprerawford to read and either let Verlee MonteDora or pt know what is next?

## 2016-06-09 NOTE — Telephone Encounter (Signed)
Please Advise

## 2016-06-10 NOTE — Telephone Encounter (Signed)
Patient scheduled apt for Wednesday the 11th of January

## 2016-06-10 NOTE — Telephone Encounter (Signed)
She can schedule visit to go through with us.

## 2016-06-14 ENCOUNTER — Ambulatory Visit
Admission: RE | Admit: 2016-06-14 | Discharge: 2016-06-14 | Disposition: A | Discharge status: Home / Self Care | Payer: Medicare Other | Source: Ambulatory Visit | Attending: Internal Medicine | Admitting: Internal Medicine

## 2016-06-14 DIAGNOSIS — Z1231 Encounter for screening mammogram for malignant neoplasm of breast: Secondary | ICD-10-CM

## 2016-06-16 ENCOUNTER — Encounter: Payer: Self-pay | Admitting: Internal Medicine

## 2016-06-16 ENCOUNTER — Ambulatory Visit (INDEPENDENT_AMBULATORY_CARE_PROVIDER_SITE_OTHER): Payer: Medicare Other | Admitting: Internal Medicine

## 2016-06-16 DIAGNOSIS — M159 Polyosteoarthritis, unspecified: Secondary | ICD-10-CM

## 2016-06-16 DIAGNOSIS — M150 Primary generalized (osteo)arthritis: Secondary | ICD-10-CM

## 2016-06-16 NOTE — Progress Notes (Signed)
   Subjective:    Patient ID: Terry HewsNancy B Rigor, female    DOB: 01/13/1934, 83 y.o.   MRN: [id number]  HPI The patient is an 83 YO female coming in for concerns about recent PT. She sent us a letter so that we could review those concerns during the visit. She is not sure if she needs to see orthopedics. She is also wondering if tens unit would help with her pain. She is also having some more pain in her thumb and wants to know if activity will worsen the pain. Overall her symptoms are improved with the PT and massage but she wants to know if there is benefit to continuing or if so how long.   Review of Systems  Constitutional: Negative.   Respiratory: Negative.   Cardiovascular: Negative.   Gastrointestinal: Negative.   Musculoskeletal: Positive for arthralgias, neck pain and neck stiffness. Negative for back pain, gait problem and joint swelling.  Skin: Negative.   Neurological: Negative.   Psychiatric/Behavioral: Negative.       Objective:   Physical Exam  Constitutional: She is oriented to person, place, and time. She appears well-developed and well-nourished.  HENT:  Head: Normocephalic and atraumatic.  Eyes: EOM are normal.  Neck: Neck supple. No JVD present.  Some limitation of ROM to the left side  Cardiovascular: Normal rate and regular rhythm.   Pulmonary/Chest: Effort normal and breath sounds normal.  Abdominal: Soft.  Musculoskeletal: She exhibits tenderness. She exhibits no edema.  Lymphadenopathy:    She has no cervical adenopathy.  Neurological: She is alert and oriented to person, place, and time. Coordination normal.  Skin: Skin is warm and dry.   Vitals:   06/16/16 1111  BP: 130/70  Pulse: 67  Resp: 14  Temp: 97.5 F (36.4 C)  TempSrc: Oral  SpO2: 99%  Weight: 127 lb 4 oz (57.7 kg)  Height: 5\' 5"  (1.651 m)      Assessment & Plan:  Visit time 25 minutes: greater than 50% of that time was spent in counseling and coordination of care with the patient.  Counseling about benefits of PT and usage of TENS unit as well as the limitations and benefits of orthopedic surgery referral as well as the lack of evidence for more imaging of her neck.

## 2016-06-16 NOTE — Patient Instructions (Signed)
We will contact the physical therapist about the tens unit.

## 2016-06-16 NOTE — Progress Notes (Signed)
Pre visit review using our clinic review tool, if applicable. No additional management support is needed unless otherwise documented below in the visit note. 

## 2016-06-16 NOTE — Assessment & Plan Note (Addendum)
No referral to orthopedics as PT has been helpful and symptoms are much improved. Will ask her PT to dispense and teach TENS unit for relief. We discussed that eventually the benefits of PT are not there and she could stop and return if needed for flares. She would like to continue for now and think about stopping in the near future. Imaging is not indicated at this time.

## 2016-06-23 ENCOUNTER — Other Ambulatory Visit: Payer: Self-pay | Admitting: Internal Medicine

## 2016-12-03 ENCOUNTER — Ambulatory Visit (INDEPENDENT_AMBULATORY_CARE_PROVIDER_SITE_OTHER): Payer: Medicare Other | Admitting: Nurse Practitioner

## 2016-12-03 VITALS — BP 132/80 | HR 62 | Temp 97.4°F | Ht 65.0 in | Wt 125.1 lb

## 2016-12-03 DIAGNOSIS — H3530 Unspecified macular degeneration: Secondary | ICD-10-CM | POA: Diagnosis not present

## 2016-12-03 NOTE — Patient Instructions (Addendum)
Request records from opthalmology from 05/082018 to present. Sign medical release.  Let Terry Robbins know if you want referral enter for [facility]  [facility]: Terry SacramentoHecker [name]  [place]  Address: [address], [place], [place] [address]   Phone: [phone]

## 2016-12-03 NOTE — Progress Notes (Signed)
Subjective:  Patient ID: Terry Robbins, female    DOB: 12/19/1933  Age: 83 y.o. MRN: [id number]  CC: Eye Problem (distorted vision in right eye )   HPI Terry Robbins has [facility]. She is dissatisfied with response from providers, due to their lack of communication to why her vision has not improved after right eye cataracts removal and injections for macular degeneration. She does not want to proceed with left cataract surgery until her vision in right eye improves. She is considering getting another opinion from another retina specialist, but will like for this office to first obtain records from [place] [facility] (10/12/2016 to present).  Right eye vision is worse than left eye and she does not understand why. She will like to know if this to permanent or temporal?  Outpatient Medications Prior to Visit  Medication Sig Dispense Refill  . Calcium Carbonate-Vitamin D (CALCIUM-D) 600-400 MG-UNIT TABS Take by mouth.    . Cholecalciferol (VITAMIN D-3) 1000 UNITS CAPS Take by mouth. Take 4800 units daily    . diclofenac sodium (VOLTAREN) 1 % GEL Apply 4 g topically 4 (four) times daily. 1 Tube 3  . guaiFENesin-codeine (ROBITUSSIN AC) 100-10 MG/5ML syrup Take 5 mLs by mouth at bedtime. 180 mL 0  . Lutein-Bilberry (LUTEIN PLUS BILBERRY PO) Take 20 mg by mouth daily.     . LUTEIN-ZEAXANTHIN PO Take by mouth daily.    . Multiple Vitamin (MULTIVITAMIN) tablet Take 1 tablet by mouth daily.      . raloxifene (EVISTA) 60 MG tablet take 1 tablet by mouth once daily 90 tablet 2   No facility-administered medications prior to visit.     ROS See HPI  Objective:  BP 132/80 (BP Location: Left Arm, Patient Position: Sitting, Cuff Size: Normal)   Pulse 62   Temp 97.4 F (36.3 C) (Oral)   Ht 5\' 5"  (1.651 m)   Wt 125 lb 1.3 oz (56.7 kg)   SpO2 98%   BMI 20.81 kg/m   BP Readings from Last 3 Encounters:  12/03/16 132/80  06/16/16 130/70  04/16/16  132/72    Wt Readings from Last 3 Encounters:  12/03/16 125 lb 1.3 oz (56.7 kg)  06/16/16 127 lb 4 oz (57.7 kg)  04/16/16 126 lb (57.2 kg)    Physical Exam  Constitutional: She is oriented to person, place, and time. No distress.  Eyes: Conjunctivae and EOM are normal. Pupils are equal, round, and reactive to light. Right eye exhibits no discharge. Left eye exhibits no discharge. No scleral icterus.  Neck: Normal range of motion. Neck supple.  Cardiovascular: Normal rate.   Pulmonary/Chest: Effort normal.  Musculoskeletal: She exhibits no edema.  Neurological: She is alert and oriented to person, place, and time.  Skin: Skin is warm and dry. No rash noted.  Psychiatric: She has a normal mood and affect. Her behavior is normal.  Vitals reviewed.   Lab Results  Component Value Date   WBC 6.2 04/16/2016   HGB 14.3 04/16/2016   HCT 41.9 04/16/2016   PLT 234.0 04/16/2016   GLUCOSE 97 04/16/2016   CHOL 198 04/16/2016   TRIG 77.0 04/16/2016   HDL 85.20 04/16/2016   LDLCALC 98 04/16/2016   ALT 11 04/16/2016   AST 18 04/16/2016   NA 143 04/16/2016   K 4.2 04/16/2016   CL 105 04/16/2016   CREATININE 0.96 04/16/2016   BUN 24 (H) 04/16/2016   CO2 31 04/16/2016   TSH 1.65  04/10/2014   INR 1.06 05/29/2010   HGBA1C 5.6 03/13/2012    Mm Screening Breast Tomo Bilateral  Result Date: 06/14/2016 CLINICAL DATA:  Screening. EXAM: 2D DIGITAL SCREENING BILATERAL MAMMOGRAM WITH CAD AND ADJUNCT TOMO COMPARISON:  Previous exam(s). ACR Breast Density Category b: There are scattered areas of fibroglandular density. FINDINGS: There are no findings suspicious for malignancy. Images were processed with CAD. IMPRESSION: No mammographic evidence of malignancy. A result letter of this screening mammogram will be mailed directly to the patient. RECOMMENDATION: Screening mammogram in one year. (Code:SM-B-01Y) BI-RADS CATEGORY  1: Negative. Electronically Signed   By: Frederico Hamman M.D.   On:  06/14/2016 14:48    Assessment & Plan:   Lekia was seen today for eye problem.  Diagnoses and all orders for this visit:  Macular degeneration (senile) of retina  I spent with Terry Robbins, with more than 50% of office visit focused on counseling patient about pros and cons on getting another opinion from a different Retina specialist.   I am having Terry Robbins maintain her multivitamin, LUTEIN-ZEAXANTHIN PO, Lutein-Bilberry (LUTEIN PLUS BILBERRY PO), Vitamin D-3, Calcium-D, guaiFENesin-codeine, diclofenac sodium, and raloxifene.  No orders of the defined types were placed in this encounter.  Follow-up: No Follow-up on file.  Alysia Penna, NP

## 2016-12-03 NOTE — Progress Notes (Signed)
Pre visit review using our clinic review tool, if applicable. No additional management support is needed unless otherwise documented below in the visit note. 

## 2016-12-07 ENCOUNTER — Encounter: Payer: Self-pay | Admitting: Nurse Practitioner

## 2017-01-05 ENCOUNTER — Ambulatory Visit (INDEPENDENT_AMBULATORY_CARE_PROVIDER_SITE_OTHER): Payer: Medicare Other | Admitting: Internal Medicine

## 2017-01-05 ENCOUNTER — Encounter: Payer: Self-pay | Admitting: Internal Medicine

## 2017-01-05 VITALS — BP 112/72 | HR 70 | Ht 65.0 in | Wt 122.0 lb

## 2017-01-05 DIAGNOSIS — J309 Allergic rhinitis, unspecified: Secondary | ICD-10-CM | POA: Diagnosis not present

## 2017-01-05 DIAGNOSIS — J029 Acute pharyngitis, unspecified: Secondary | ICD-10-CM | POA: Insufficient documentation

## 2017-01-05 DIAGNOSIS — F411 Generalized anxiety disorder: Secondary | ICD-10-CM

## 2017-01-05 MED ORDER — CLINDAMYCIN HCL 300 MG PO CAPS: 300.0000 mg | ORAL_CAPSULE | Freq: Three times a day (TID) | ORAL | 0 refills | Status: DC

## 2017-01-05 NOTE — Patient Instructions (Signed)
Please take all new medication as prescribed - the antibiotic  Please continue all other medications as before, and refills have been done if requested.  Please have the pharmacy call with any other refills you may need.  Please keep your appointments with your specialists as you may have planned   

## 2017-01-05 NOTE — Progress Notes (Signed)
Subjective:    Patient ID: Terry HewsNancy B Robbins, female    DOB: 08/04/1933, 83 y.o.   MRN: [id number]  HPI  Here with 2-3 days onset acute onset fever, roof of mouth and severe ST pain, pressure, headache, general weakness and malaise, with pain starting after a small cut to the roof of the mouth with eating chips, but pt denies chest pain, wheezing, increased sob or doe, orthopnea, PND, increased LE swelling, palpitations, dizziness or syncope.  Pt denies new neurological symptoms such as new headache, or facial or extremity weakness or numbness   Pt denies polydipsia, polyuria, or low sugar symptoms such as weakness or confusion improved with po intake.  Pt states overall good compliance with meds, trying to follow lower cholesterol, diabetic diet, wt overall stable but little exercise however.   Does have several wks ongoing nasal allergy symptoms with clearish congestion, itch and sneezing, Denies worsening depressive symptoms, suicidal ideation, or panic; has ongoing anxiety, quite nervous today but declines need for tx. Past Medical History:  Diagnosis Date  . ARTHRITIS   . Empyema, left (HCC) 04/2010   related to PNA; s/p VATS/minithoracotomy & bronch  . MACULAR DEGENERATION   . OA (osteoarthritis)   . OSTEOPOROSIS   . Psoriasis    Past Surgical History:  Procedure Laterality Date  . Ganglion cyst (L) thumb    . MOUTH SURGERY    . VATS/Minithracotomy and bronch  04/2010    reports that she quit smoking about 39 years ago. Her smoking use included Cigarettes. She has a 20.00 pack-year smoking history. She has never used smokeless tobacco. She reports that she does not drink alcohol or use drugs. family history includes Dementia in her other; Diabetes in her other; Hypertension in her other; Lung cancer in her other. Allergies  Allergen Reactions  . Zinc     Has implants   . Cephalosporins Rash   Current Outpatient Prescriptions on File Prior to Visit  Medication Sig Dispense Refill    . Calcium Carbonate-Vitamin D (CALCIUM-D) 600-400 MG-UNIT TABS Take by mouth.    . Cholecalciferol (VITAMIN D-3) 1000 UNITS CAPS Take by mouth. Take 4800 units daily    . diclofenac sodium (VOLTAREN) 1 % GEL Apply 4 g topically 4 (four) times daily. 1 Tube 3  . guaiFENesin-codeine (ROBITUSSIN AC) 100-10 MG/5ML syrup Take 5 mLs by mouth at bedtime. 180 mL 0  . Lutein-Bilberry (LUTEIN PLUS BILBERRY PO) Take 20 mg by mouth daily.     . LUTEIN-ZEAXANTHIN PO Take by mouth daily.    . Multiple Vitamin (MULTIVITAMIN) tablet Take 1 tablet by mouth daily.      . raloxifene (EVISTA) 60 MG tablet take 1 tablet by mouth once daily 90 tablet 2   No current facility-administered medications on file prior to visit.    Review of Systems  Constitutional: Negative for other unusual diaphoresis or sweats HENT: Negative for ear discharge or swelling Eyes: Negative for other worsening visual disturbances Respiratory: Negative for stridor or other swelling  Gastrointestinal: Negative for worsening distension or other blood Genitourinary: Negative for retention or other urinary change Musculoskeletal: Negative for other MSK pain or swelling Skin: Negative for color change or other new lesions Neurological: Negative for worsening tremors and other numbness  Psychiatric/Behavioral: Negative for worsening agitation or other fatigue All other system neg per pt    Objective:   Physical Exam BP 112/72   Pulse 70   Ht 5\' 5"  (1.651 m)   Wt 122 lb (  55.3 kg)   SpO2 98%   BMI 20.30 kg/m  VS noted, mild ill Constitutional: Pt appears in NAD HENT: Head: NCAT.  Right Ear: External ear normal.  Left Ear: External ear normal.  Eyes: . Pupils are equal, round, and reactive to light. Conjunctivae and EOM are normal Nose: without d/c or deformity Bilat tm's with mild erythema.  Max sinus areas mild tender.  Pharynx with severe erythema and small abrasion noted to upper palate near the soft palate with sweling but no  abscess, no exudate Neck: Neck supple. Gross normal ROM Cardiovascular: Normal rate and regular rhythm.   Pulmonary/Chest: Effort normal and breath sounds without rales or wheezing.  Neurological: Pt is alert. At baseline orientation, motor grossly intact Skin: Skin is warm. No rashes, other new lesions, no LE edema Psychiatric: Pt behavior is normal without agitation , 1-2+ nervous No other exam findings    Assessment & Plan:

## 2017-01-07 NOTE — Assessment & Plan Note (Signed)
With situational worsening, reassured, declines other tx

## 2017-01-07 NOTE — Assessment & Plan Note (Signed)
Mild, for otc claritin,  to f/u any worsening symptoms or concerns

## 2017-01-07 NOTE — Assessment & Plan Note (Signed)
Mild to mod, for antibx course,  to f/u any worsening symptoms or concerns 

## 2017-01-14 ENCOUNTER — Ambulatory Visit (INDEPENDENT_AMBULATORY_CARE_PROVIDER_SITE_OTHER): Payer: Medicare Other | Admitting: Family Medicine

## 2017-01-14 ENCOUNTER — Encounter: Payer: Self-pay | Admitting: Family Medicine

## 2017-01-14 VITALS — BP 138/82 | HR 74 | Temp 98.0°F | Ht 65.0 in | Wt 123.1 lb

## 2017-01-14 DIAGNOSIS — N760 Acute vaginitis: Secondary | ICD-10-CM

## 2017-01-14 NOTE — Patient Instructions (Signed)
Combined a small amount of over-the-counter yeast cream such as Monistat or clotrimazole with a small amount of hydrocortisone cream and apply twice daily for 1-2 weeks.  Please follow up with your doctor if any worsening of her symptoms or your symptoms persist despite treatment.  I hope you're feeling better seen!

## 2017-01-14 NOTE — Progress Notes (Signed)
HPI:  Acute visit for vulvovaginal irritation: -Started the last few days after taking an antibiotic for a dental issue related to a potato chip -Has itching and irritation of the vulvovaginal tissues -No significant discharge, fever, pelvic pain, abdominal pain or dysuria  ROS: See pertinent positives and negatives per HPI.  Past Medical History:  Diagnosis Date  . ARTHRITIS   . Empyema, left (HCC) 04/2010   related to PNA; s/p VATS/minithoracotomy & bronch  . MACULAR DEGENERATION   . OA (osteoarthritis)   . OSTEOPOROSIS   . Psoriasis     Past Surgical History:  Procedure Laterality Date  . Ganglion cyst (L) thumb    . MOUTH SURGERY    . VATS/Minithracotomy and bronch  04/2010    Family History  Problem Relation Age of Onset  . Diabetes Other        Parent  . Hypertension Other        parent  . Dementia Other        Parent  . Lung cancer Other        Parent    Social History   Social History  . Marital status: Single    Spouse name: N/A  . Number of children: N/A  . Years of education: N/A   Social History Main Topics  . Smoking status: Former Smoker    Packs/day: 1.00    Years: 20.00    Types: Cigarettes    Quit date: 06/07/1977  . Smokeless tobacco: Never Used  . Alcohol use No  . Drug use: No  . Sexual activity: Not Asked   Other Topics Concern  . None   Social History Narrative   Divorced, lives alone- Very active at [employer], church. retired from [employer] 2006     Current Outpatient Prescriptions:  .  Calcium Carbonate-Vitamin D (CALCIUM-D) 600-400 MG-UNIT TABS, Take by mouth., Disp: , Rfl:  .  Cholecalciferol (VITAMIN D-3) 1000 UNITS CAPS, Take by mouth. Take 4800 units daily, Disp: , Rfl:  .  diclofenac sodium (VOLTAREN) 1 % GEL, Apply 4 g topically 4 (four) times daily., Disp: 1 Tube, Rfl: 3 .  guaiFENesin-codeine (ROBITUSSIN AC) 100-10 MG/5ML syrup, Take 5 mLs by mouth at bedtime., Disp: 180 mL, Rfl: 0 .  Lutein-Bilberry (LUTEIN PLUS  BILBERRY PO), Take 20 mg by mouth daily. , Disp: , Rfl:  .  LUTEIN-ZEAXANTHIN PO, Take by mouth daily., Disp: , Rfl:  .  Multiple Vitamin (MULTIVITAMIN) tablet, Take 1 tablet by mouth daily.  , Disp: , Rfl:  .  raloxifene (EVISTA) 60 MG tablet, take 1 tablet by mouth once daily, Disp: 90 tablet, Rfl: 2  EXAM:  Vitals:   01/14/17 1608  BP: 138/82  Pulse: 74  Temp: 98 F (36.7 C)    Body mass index is 20.48 kg/m.  GENERAL: vitals reviewed and listed above, alert, oriented, appears well hydrated and in no acute distress  HEENT: atraumatic, conjunttiva clear, no obvious abnormalities on inspection of external nose and ears  NECK: no obvious masses on inspection  GU: Vulvar irritation, small amount of white curdy discharge in the external vaginal area  MS: moves all extremities without noticeable abnormality  PSYCH: pleasant and cooperative, no obvious depression or anxiety  ASSESSMENT AND PLAN:  Discussed the following assessment and plan:  Vulvovaginitis  -we discussed possible serious and likely etiologies, workup and treatment, treatment risks and return precautions -after this discussion, Harriett Sineancy opted for topical over-the-counter treatments for a likely yeast vv with close  follow-up if symptoms persist -follow up advised as needed for this issue -of course, we advised Cesiah  to return or notify a doctor immediately if symptoms worsen or persist or new concerns arise.  Patient Instructions  Combined a small amount of over-the-counter yeast cream such as Monistat or clotrimazole with a small amount of hydrocortisone cream and apply twice daily for 1-2 weeks.  Please follow up with your doctor if any worsening of her symptoms or your symptoms persist despite treatment.  I hope you're feeling better seen!   Kriste Basque R., DO

## 2017-03-25 ENCOUNTER — Encounter: Payer: Self-pay | Admitting: Internal Medicine

## 2017-03-25 ENCOUNTER — Ambulatory Visit (INDEPENDENT_AMBULATORY_CARE_PROVIDER_SITE_OTHER): Payer: Medicare Other | Admitting: Internal Medicine

## 2017-03-25 VITALS — BP 130/82 | HR 73 | Temp 98.1°F | Ht 65.0 in | Wt 122.0 lb

## 2017-03-25 DIAGNOSIS — H3530 Unspecified macular degeneration: Secondary | ICD-10-CM | POA: Diagnosis not present

## 2017-03-25 DIAGNOSIS — Z23 Encounter for immunization: Secondary | ICD-10-CM

## 2017-03-25 NOTE — Assessment & Plan Note (Signed)
She is unsure of the name of the medication they are injecting into her eye so I am unable to look this up to see if pain is normal. Offered referral to retina specialist for a second opinion but she declines at this time.

## 2017-03-25 NOTE — Patient Instructions (Signed)
High-Fiber Diet Fiber, also called dietary fiber, is a type of carbohydrate found in fruits, vegetables, whole grains, and beans. A high-fiber diet can have many health benefits. Your health care provider may recommend a high-fiber diet to help:  Prevent constipation. Fiber can make your bowel movements more regular.  Lower your cholesterol.  Relieve hemorrhoids, uncomplicated diverticulosis, or irritable bowel syndrome.  Prevent overeating as part of a weight-loss plan.  Prevent heart disease, type 2 diabetes, and certain cancers.  What is my plan? The recommended daily intake of fiber includes:  38 grams for men under age 50.  30 grams for men over age 50.  25 grams for women under age 50.  21 grams for women over age 50.  You can get the recommended daily intake of dietary fiber by eating a variety of fruits, vegetables, grains, and beans. Your health care provider may also recommend a fiber supplement if it is not possible to get enough fiber through your diet. What do I need to know about a high-fiber diet?  Fiber supplements have not been widely studied for their effectiveness, so it is better to get fiber through food sources.  Always check the fiber content on thenutrition facts label of any prepackaged food. Look for foods that contain at least 5 grams of fiber per serving.  Ask your dietitian if you have questions about specific foods that are related to your condition, especially if those foods are not listed in the following section.  Increase your daily fiber consumption gradually. Increasing your intake of dietary fiber too quickly may cause bloating, cramping, or gas.  Drink plenty of water. Water helps you to digest fiber. What foods can I eat? Grains Whole-grain breads. Multigrain cereal. Oats and oatmeal. Brown rice. Barley. Bulgur wheat. Millet. Bran muffins. Popcorn. Rye wafer crackers. Vegetables Sweet potatoes. Spinach. Kale. Artichokes. Cabbage.  Broccoli. Green peas. Carrots. Squash. Fruits Berries. Pears. Apples. Oranges. Avocados. Prunes and raisins. Dried figs. Meats and Other Protein Sources Navy, kidney, pinto, and soy beans. Split peas. Lentils. Nuts and seeds. Dairy Fiber-fortified yogurt. Beverages Fiber-fortified soy milk. Fiber-fortified orange juice. Other Fiber bars. The items listed above may not be a complete list of recommended foods or beverages. Contact your dietitian for more options. What foods are not recommended? Grains White bread. Pasta made with refined flour. White rice. Vegetables Fried potatoes. Canned vegetables. Well-cooked vegetables. Fruits Fruit juice. Cooked, strained fruit. Meats and Other Protein Sources Fatty cuts of meat. Fried poultry or fried fish. Dairy Milk. Yogurt. Cream cheese. Sour cream. Beverages Soft drinks. Other Cakes and pastries. Butter and oils. The items listed above may not be a complete list of foods and beverages to avoid. Contact your dietitian for more information. What are some tips for including high-fiber foods in my diet?  Eat a wide variety of high-fiber foods.  Make sure that half of all grains consumed each day are whole grains.  Replace breads and cereals made from refined flour or white flour with whole-grain breads and cereals.  Replace white rice with brown rice, bulgur wheat, or millet.  Start the day with a breakfast that is high in fiber, such as a cereal that contains at least 5 grams of fiber per serving.  Use beans in place of meat in soups, salads, or pasta.  Eat high-fiber snacks, such as berries, raw vegetables, nuts, or popcorn. This information is not intended to replace advice given to you by your health care provider. Make sure you discuss any   questions you have with your health care provider. Document Released: 05/24/2005 Document Revised: 10/30/2015 Document Reviewed: 11/06/2013 Elsevier Interactive Patient Education  2017  Elsevier Inc.  

## 2017-03-25 NOTE — Progress Notes (Signed)
   Subjective:    Patient ID: Terry HewsNancy B Ellers, female    DOB: 03/28/1934, 83 y.o.   MRN: [id number]  HPI The patient is an 83 YO female coming in for concerns about her macular degeneration. She has been getting injections every 5 weeks. This has helped her vision but the eye specialist has a poor bedside manner and she sometimes gets pain with injections and large floaters that take 1-2 days to resolve. She is not sure if she needs to get a second opinion or if this could mess up her treatment. She denies change in vision or eye pain currently.   Review of Systems  Constitutional: Negative.   HENT: Negative.   Eyes: Positive for pain.  Respiratory: Negative for cough, chest tightness and shortness of breath.   Cardiovascular: Negative for chest pain, palpitations and leg swelling.  Gastrointestinal: Negative for abdominal distention, abdominal pain, constipation, diarrhea, nausea and vomiting.  Musculoskeletal: Negative.   Skin: Negative.   Neurological: Negative.   Psychiatric/Behavioral: Negative.       Objective:   Physical Exam  Constitutional: She is oriented to person, place, and time. She appears well-developed and well-nourished.  HENT:  Head: Normocephalic and atraumatic.  Eyes: EOM are normal.  Neck: Normal range of motion.  Cardiovascular: Normal rate and regular rhythm.   Pulmonary/Chest: Effort normal and breath sounds normal. No respiratory distress. She has no wheezes. She has no rales.  Abdominal: Soft. Bowel sounds are normal. She exhibits no distension. There is no tenderness. There is no rebound.  Musculoskeletal: She exhibits no edema.  Neurological: She is alert and oriented to person, place, and time. Coordination normal.  Skin: Skin is warm and dry.  Psychiatric: She has a normal mood and affect.   Vitals:   03/25/17 1432  BP: 130/82  Pulse: 73  Temp: 98.1 F (36.7 C)  TempSrc: Oral  SpO2: 100%  Weight: 122 lb (55.3 kg)  Height: 5\' 5"  (1.651 m)     Assessment & Plan:  Flu shot given at visit

## 2017-04-08 ENCOUNTER — Other Ambulatory Visit: Payer: Self-pay

## 2017-04-08 MED ORDER — RALOXIFENE HCL 60 MG PO TABS: 60.0000 mg | ORAL_TABLET | Freq: Every day | ORAL | 2 refills | Status: DC

## 2017-04-18 ENCOUNTER — Ambulatory Visit (INDEPENDENT_AMBULATORY_CARE_PROVIDER_SITE_OTHER)
Admission: RE | Admit: 2017-04-18 | Discharge: 2017-04-18 | Disposition: A | Discharge status: Home / Self Care | Payer: Medicare Other | Source: Ambulatory Visit | Attending: Internal Medicine | Admitting: Internal Medicine

## 2017-04-18 ENCOUNTER — Ambulatory Visit (INDEPENDENT_AMBULATORY_CARE_PROVIDER_SITE_OTHER): Payer: Medicare Other | Admitting: Internal Medicine

## 2017-04-18 ENCOUNTER — Other Ambulatory Visit (INDEPENDENT_AMBULATORY_CARE_PROVIDER_SITE_OTHER): Payer: Medicare Other

## 2017-04-18 ENCOUNTER — Encounter: Payer: Self-pay | Admitting: Internal Medicine

## 2017-04-18 VITALS — BP 118/78 | HR 70 | Temp 97.7°F | Ht 65.0 in | Wt 118.0 lb

## 2017-04-18 DIAGNOSIS — E2839 Other primary ovarian failure: Secondary | ICD-10-CM

## 2017-04-18 DIAGNOSIS — Z0000 Encounter for general adult medical examination without abnormal findings: Secondary | ICD-10-CM | POA: Diagnosis not present

## 2017-04-18 DIAGNOSIS — M150 Primary generalized (osteo)arthritis: Secondary | ICD-10-CM | POA: Diagnosis not present

## 2017-04-18 DIAGNOSIS — M159 Polyosteoarthritis, unspecified: Secondary | ICD-10-CM

## 2017-04-18 DIAGNOSIS — M8580 Other specified disorders of bone density and structure, unspecified site: Secondary | ICD-10-CM | POA: Diagnosis not present

## 2017-04-18 LAB — URINALYSIS, ROUTINE W REFLEX MICROSCOPIC
BILIRUBIN URINE: NEGATIVE
HGB URINE DIPSTICK: NEGATIVE
Ketones, ur: NEGATIVE
LEUKOCYTES UA: NEGATIVE
NITRITE: NEGATIVE
RBC / HPF: NONE SEEN (ref 0–?)
Specific Gravity, Urine: >=1.03 — AB (ref 1.000–1.030)
TOTAL PROTEIN, URINE-UPE24: NEGATIVE
Urine Glucose: NEGATIVE
Urobilinogen, UA: 0.2 (ref 0.0–1.0)
pH: 5.5 (ref 5.0–8.0)

## 2017-04-18 LAB — COMPREHENSIVE METABOLIC PANEL
ALK PHOS: 53 U/L (ref 39–117)
ALT: 11 U/L (ref 0–35)
AST: 16 U/L (ref 0–37)
Albumin: 4 g/dL (ref 3.5–5.2)
BILIRUBIN TOTAL: 0.6 mg/dL (ref 0.2–1.2)
BUN: 22 mg/dL (ref 6–23)
CO2: 32 meq/L (ref 19–32)
CREATININE: 0.92 mg/dL (ref 0.40–1.20)
Calcium: 9.6 mg/dL (ref 8.4–10.5)
Chloride: 104 mEq/L (ref 96–112)
GFR: 62.19 mL/min (ref >60.00)
GLUCOSE: 100 mg/dL — AB (ref 70–99)
Potassium: 4.2 mEq/L (ref 3.5–5.1)
Sodium: 143 mEq/L (ref 135–145)
TOTAL PROTEIN: 6.9 g/dL (ref 6.0–8.3)

## 2017-04-18 LAB — CBC
HCT: 43.1 % (ref 36.0–46.0)
Hemoglobin: 14.2 g/dL (ref 12.0–15.0)
MCHC: 33 g/dL (ref 30.0–36.0)
MCV: 90.3 fl (ref 78.0–100.0)
Platelets: 289 10*3/uL (ref 150.0–400.0)
RBC: 4.77 Mil/uL (ref 3.87–5.11)
RDW: 13.6 % (ref 11.5–15.5)
WBC: 6.2 10*3/uL (ref 4.0–10.5)

## 2017-04-18 LAB — LIPID PANEL
CHOL/HDL RATIO: 3
CHOLESTEROL: 204 mg/dL — AB (ref 0–200)
HDL: 80.4 mg/dL (ref >39.00)
LDL Cholesterol: 109 mg/dL — ABNORMAL HIGH (ref 0–99)
NonHDL: 123.95
TRIGLYCERIDES: 74 mg/dL (ref 0.0–149.0)
VLDL: 14.8 mg/dL (ref 0.0–40.0)

## 2017-04-18 NOTE — Assessment & Plan Note (Signed)
On evista and needs repeat DEXA.

## 2017-04-18 NOTE — Progress Notes (Signed)
   Subjective:    Patient ID: Terry Robbins, female    DOB: 07/17/1933, 83 y.o.   MRN: [id number]  HPI Here for medicare wellness and CPE, no new complaints. Please see A/P for status and treatment of chronic medical problems.   Diet: heart healthy  Physical activity: plays ping pong for exercise Depression/mood screen: negative Hearing: intact to whispered voice with bilateral hearing aids Visual acuity: grossly normal with lens, performs annual eye exam  ADLs: capable Fall risk: none Home safety: good Cognitive evaluation: intact to orientation, naming, recall and repetition EOL planning: adv directives discussed  I have personally reviewed and have noted 1. The patient's medical and social history - reviewed today no changes 2. Their use of alcohol, tobacco or illicit drugs 3. Their current medications and supplements 4. The patient's functional ability including ADL's, fall risks, home safety risks and hearing or visual impairment. 5. Diet and physical activities 6. Evidence for depression or mood disorders 7. Care team reviewed and updated (available in snapshot)  Review of Systems  Constitutional: Negative.   HENT: Negative.   Eyes: Negative.   Respiratory: Negative for cough, chest tightness and shortness of breath.   Cardiovascular: Negative for chest pain, palpitations and leg swelling.  Gastrointestinal: Negative for abdominal distention, abdominal pain, constipation, diarrhea, nausea and vomiting.  Musculoskeletal: Negative.   Skin: Negative.   Neurological: Negative.   Psychiatric/Behavioral: Negative.       Objective:   Physical Exam  Constitutional: She is oriented to person, place, and time. She appears well-developed and well-nourished.  HENT:  Head: Normocephalic and atraumatic.  Eyes: EOM are normal.  Neck: Normal range of motion.  Cardiovascular: Normal rate and regular rhythm.  Pulmonary/Chest: Effort normal and breath sounds normal. No respiratory  distress. She has no wheezes. She has no rales.  Abdominal: Soft. Bowel sounds are normal. She exhibits no distension. There is no tenderness. There is no rebound.  Musculoskeletal: She exhibits no edema.  Neurological: She is alert and oriented to person, place, and time. Coordination normal.  Skin: Skin is warm and dry.  Psychiatric: She has a normal mood and affect.   Vitals:   04/18/17 0800  BP: 118/78  Pulse: 70  Temp: 97.7 F (36.5 C)  TempSrc: Oral  SpO2: 100%  Weight: 118 lb (53.5 kg)  Height: 5\' 5"  (1.651 m)      Assessment & Plan:

## 2017-04-18 NOTE — Patient Instructions (Signed)
We have ordered the bone density and the labs.  Health Maintenance, Female Adopting a healthy lifestyle and getting preventive care can go a long way to promote health and wellness. Talk with your health care provider about what schedule of regular examinations is right for you. This is a good chance for you to check in with your provider about disease prevention and staying healthy. In between checkups, there are plenty of things you can do on your own. Experts have done a lot of research about which lifestyle changes and preventive measures are most likely to keep you healthy. Ask your health care provider for more information. Weight and diet Eat a healthy diet  Be sure to include plenty of vegetables, fruits, low-fat dairy products, and lean protein.  Do not eat a lot of foods high in solid fats, added sugars, or salt.  Get regular exercise. This is one of the most important things you can do for your health. ? Most adults should exercise for at least 150 minutes each week. The exercise should increase your heart rate and make you sweat (moderate-intensity exercise). ? Most adults should also do strengthening exercises at least twice a week. This is in addition to the moderate-intensity exercise.  Maintain a healthy weight  Body mass index (BMI) is a measurement that can be used to identify possible weight problems. It estimates body fat based on height and weight. Your health care provider can help determine your BMI and help you achieve or maintain a healthy weight.  For females 49 years of age and older: ? A BMI below 18.5 is considered underweight. ? A BMI of 18.5 to 24.9 is normal. ? A BMI of 25 to 29.9 is considered overweight. ? A BMI of 30 and above is considered obese.  Watch levels of cholesterol and blood lipids  You should start having your blood tested for lipids and cholesterol at 81 years of age, then have this test every 5 years.  You may need to have your  cholesterol levels checked more often if: ? Your lipid or cholesterol levels are high. ? You are older than 81 years of age. ? You are at high risk for heart disease.  Cancer screening Lung Cancer  Lung cancer screening is recommended for adults 29-51 years old who are at high risk for lung cancer because of a history of smoking.  A yearly low-dose CT scan of the lungs is recommended for people who: ? Currently smoke. ? Have quit within the past 15 years. ? Have at least a 30-pack-year history of smoking. A pack year is smoking an average of one pack of cigarettes a day for 1 year.  Yearly screening should continue until it has been 15 years since you quit.  Yearly screening should stop if you develop a health problem that would prevent you from having lung cancer treatment.  Breast Cancer  Practice breast self-awareness. This means understanding how your breasts normally appear and feel.  It also means doing regular breast self-exams. Let your health care provider know about any changes, no matter how small.  If you are in your 20s or 30s, you should have a clinical breast exam (CBE) by a health care provider every 1-3 years as part of a regular health exam.  If you are 69 or older, have a CBE every year. Also consider having a breast X-ray (mammogram) every year.  If you have a family history of breast cancer, talk to your health care provider  about genetic screening.  If you are at high risk for breast cancer, talk to your health care provider about having an MRI and a mammogram every year.  Breast cancer gene (BRCA) assessment is recommended for women who have family members with BRCA-related cancers. BRCA-related cancers include: ? Breast. ? Ovarian. ? Tubal. ? Peritoneal cancers.  Results of the assessment will determine the need for genetic counseling and BRCA1 and BRCA2 testing.  Cervical Cancer Your health care provider may recommend that you be screened regularly  for cancer of the pelvic organs (ovaries, uterus, and vagina). This screening involves a pelvic examination, including checking for microscopic changes to the surface of your cervix (Pap test). You may be encouraged to have this screening done every 3 years, beginning at age 19.  For women ages 50-65, health care providers may recommend pelvic exams and Pap testing every 3 years, or they may recommend the Pap and pelvic exam, combined with testing for human papilloma virus (HPV), every 5 years. Some types of HPV increase your risk of cervical cancer. Testing for HPV may also be done on women of any age with unclear Pap test results.  Other health care providers may not recommend any screening for nonpregnant women who are considered low risk for pelvic cancer and who do not have symptoms. Ask your health care provider if a screening pelvic exam is right for you.  If you have had past treatment for cervical cancer or a condition that could lead to cancer, you need Pap tests and screening for cancer for at least 20 years after your treatment. If Pap tests have been discontinued, your risk factors (such as having a new sexual partner) need to be reassessed to determine if screening should resume. Some women have medical problems that increase the chance of getting cervical cancer. In these cases, your health care provider may recommend more frequent screening and Pap tests.  Colorectal Cancer  This type of cancer can be detected and often prevented.  Routine colorectal cancer screening usually begins at 81 years of age and continues through 81 years of age.  Your health care provider may recommend screening at an earlier age if you have risk factors for colon cancer.  Your health care provider may also recommend using home test kits to check for hidden blood in the stool.  A small camera at the end of a tube can be used to examine your colon directly (sigmoidoscopy or colonoscopy). This is done to  check for the earliest forms of colorectal cancer.  Routine screening usually begins at age 28.  Direct examination of the colon should be repeated every 5-10 years through 81 years of age. However, you may need to be screened more often if early forms of precancerous polyps or small growths are found.  Skin Cancer  Check your skin from head to toe regularly.  Tell your health care provider about any new moles or changes in moles, especially if there is a change in a mole's shape or color.  Also tell your health care provider if you have a mole that is larger than the size of a pencil eraser.  Always use sunscreen. Apply sunscreen liberally and repeatedly throughout the day.  Protect yourself by wearing long sleeves, pants, a wide-brimmed hat, and sunglasses whenever you are outside.  Heart disease, diabetes, and high blood pressure  High blood pressure causes heart disease and increases the risk of stroke. High blood pressure is more likely to develop in: ?  People who have blood pressure in the high end of the normal range (130-139/85-89 mm Hg). ? People who are overweight or obese. ? People who are African American.  If you are 18-39 years of age, have your blood pressure checked every 3-5 years. If you are 40 years of age or older, have your blood pressure checked every year. You should have your blood pressure measured twice-once when you are at a hospital or clinic, and once when you are not at a hospital or clinic. Record the average of the two measurements. To check your blood pressure when you are not at a hospital or clinic, you can use: ? An automated blood pressure machine at a pharmacy. ? A home blood pressure monitor.  If you are between 55 years and 79 years old, ask your health care provider if you should take aspirin to prevent strokes.  Have regular diabetes screenings. This involves taking a blood sample to check your fasting blood sugar level. ? If you are at a  normal weight and have a low risk for diabetes, have this test once every three years after 81 years of age. ? If you are overweight and have a high risk for diabetes, consider being tested at a younger age or more often. Preventing infection Hepatitis B  If you have a higher risk for hepatitis B, you should be screened for this virus. You are considered at high risk for hepatitis B if: ? You were born in a country where hepatitis B is common. Ask your health care provider which countries are considered high risk. ? Your parents were born in a high-risk country, and you have not been immunized against hepatitis B (hepatitis B vaccine). ? You have HIV or AIDS. ? You use needles to inject street drugs. ? You live with someone who has hepatitis B. ? You have had sex with someone who has hepatitis B. ? You get hemodialysis treatment. ? You take certain medicines for conditions, including cancer, organ transplantation, and autoimmune conditions.  Hepatitis C  Blood testing is recommended for: ? Everyone born from 1945 through 1965. ? Anyone with known risk factors for hepatitis C.  Sexually transmitted infections (STIs)  You should be screened for sexually transmitted infections (STIs) including gonorrhea and chlamydia if: ? You are sexually active and are younger than 81 years of age. ? You are older than 81 years of age and your health care provider tells you that you are at risk for this type of infection. ? Your sexual activity has changed since you were last screened and you are at an increased risk for chlamydia or gonorrhea. Ask your health care provider if you are at risk.  If you do not have HIV, but are at risk, it may be recommended that you take a prescription medicine daily to prevent HIV infection. This is called pre-exposure prophylaxis (PrEP). You are considered at risk if: ? You are sexually active and do not regularly use condoms or know the HIV status of your  partner(s). ? You take drugs by injection. ? You are sexually active with a partner who has HIV.  Talk with your health care provider about whether you are at high risk of being infected with HIV. If you choose to begin PrEP, you should first be tested for HIV. You should then be tested every 3 months for as long as you are taking PrEP. Pregnancy  If you are premenopausal and you may become pregnant, ask your health   care provider about preconception counseling.  If you may become pregnant, take 400 to 800 micrograms (mcg) of folic acid every day.  If you want to prevent pregnancy, talk to your health care provider about birth control (contraception). Osteoporosis and menopause  Osteoporosis is a disease in which the bones lose minerals and strength with aging. This can result in serious bone fractures. Your risk for osteoporosis can be identified using a bone density scan.  If you are 65 years of age or older, or if you are at risk for osteoporosis and fractures, ask your health care provider if you should be screened.  Ask your health care provider whether you should take a calcium or vitamin D supplement to lower your risk for osteoporosis.  Menopause may have certain physical symptoms and risks.  Hormone replacement therapy may reduce some of these symptoms and risks. Talk to your health care provider about whether hormone replacement therapy is right for you. Follow these instructions at home:  Schedule regular health, dental, and eye exams.  Stay current with your immunizations.  Do not use any tobacco products including cigarettes, chewing tobacco, or electronic cigarettes.  If you are pregnant, do not drink alcohol.  If you are breastfeeding, limit how much and how often you drink alcohol.  Limit alcohol intake to no more than 1 drink per day for nonpregnant women. One drink equals 12 ounces of beer, 5 ounces of wine, or 1 ounces of hard liquor.  Do not use street  drugs.  Do not share needles.  Ask your health care provider for help if you need support or information about quitting drugs.  Tell your health care provider if you often feel depressed.  Tell your health care provider if you have ever been abused or do not feel safe at home. This information is not intended to replace advice given to you by your health care provider. Make sure you discuss any questions you have with your health care provider. Document Released: 12/07/2010 Document Revised: 10/30/2015 Document Reviewed: 02/25/2015 Elsevier Interactive Patient Education  2018 Elsevier Inc.  

## 2017-04-18 NOTE — Assessment & Plan Note (Signed)
Turmeric is helping a lot with her arthritis.

## 2017-04-18 NOTE — Assessment & Plan Note (Signed)
Flu and tetanus and pneumonia up to date. Counseled about shingrix. Colonoscopy up to date and aged out of further. Mammogram up to date. DEXA ordered. Counseled about sun safety and mole surveillance. Given 10 year screening recommendations.

## 2017-04-25 ENCOUNTER — Telehealth: Payer: Self-pay | Admitting: Internal Medicine

## 2017-04-25 NOTE — Telephone Encounter (Signed)
Patient informed and stated understanding.

## 2017-04-25 NOTE — Telephone Encounter (Signed)
Pt called stating she learned of a medication through her friend, call Probiotic and it has Lactobacillus and Acidophilus in it, and she would like to know if Dr. Okey Duprerawford thinks she should start this since the last antibiotic cleaned out her colon and if she should take this to get the "bad" back into her system quicker than a year like Crawford said.  Please advise and call back

## 2017-04-25 NOTE — Telephone Encounter (Signed)
It is harmless to try. May help the colon.

## 2017-05-12 ENCOUNTER — Other Ambulatory Visit: Payer: Self-pay | Admitting: Internal Medicine

## 2017-05-12 DIAGNOSIS — Z1231 Encounter for screening mammogram for malignant neoplasm of breast: Secondary | ICD-10-CM

## 2017-06-15 ENCOUNTER — Ambulatory Visit
Admission: RE | Admit: 2017-06-15 | Discharge: 2017-06-15 | Disposition: A | Discharge status: Home / Self Care | Payer: Medicare Other | Source: Ambulatory Visit | Attending: Internal Medicine | Admitting: Internal Medicine

## 2017-06-15 DIAGNOSIS — Z1231 Encounter for screening mammogram for malignant neoplasm of breast: Secondary | ICD-10-CM

## 2017-08-13 ENCOUNTER — Encounter: Payer: Self-pay | Admitting: Internal Medicine

## 2017-08-13 ENCOUNTER — Ambulatory Visit: Payer: Medicare Other | Admitting: Internal Medicine

## 2017-08-13 VITALS — BP 112/72 | HR 80 | Temp 97.8°F | Ht 65.0 in | Wt 122.0 lb

## 2017-08-13 DIAGNOSIS — F411 Generalized anxiety disorder: Secondary | ICD-10-CM | POA: Diagnosis not present

## 2017-08-13 DIAGNOSIS — L03115 Cellulitis of right lower limb: Secondary | ICD-10-CM

## 2017-08-13 DIAGNOSIS — S81811A Laceration without foreign body, right lower leg, initial encounter: Secondary | ICD-10-CM

## 2017-08-13 MED ORDER — AZITHROMYCIN 250 MG PO TABS: ORAL_TABLET | ORAL | 1 refills | Status: DC

## 2017-08-13 NOTE — Assessment & Plan Note (Signed)
For conservative management, vaseline daily with dressing changes till healed

## 2017-08-13 NOTE — Progress Notes (Signed)
Subjective:    Patient ID: Terry Robbins, female    DOB: 02/06/1934, 84 y.o.   MRN: [id number]  HPI  Here to f/u after unfortunate trip and fall striking the right mid anterior leg to a hard table about 10 days ago.  Not on blood thinner, initial bleeding stopped relatively quickly.  Has been using dry gauze dressings and neosporin but in last 2 days has had surrounding erythema, swelling and slight increased weepiness but no overt abscess or drainage. No fever, chills, red streaks.  Pt denies chest pain, increased sob or doe, wheezing, orthopnea, PND, increased LE swelling, palpitations, dizziness or syncope.   Pt denies polydipsia, polyuria.  Denies worsening depressive symptoms, suicidal ideation, or panic; has ongoing anxiety Past Medical History:  Diagnosis Date  . ARTHRITIS   . Empyema, left (HCC) 04/2010   related to PNA; s/p VATS/minithoracotomy & bronch  . MACULAR DEGENERATION   . OA (osteoarthritis)   . OSTEOPOROSIS   . Psoriasis    Past Surgical History:  Procedure Laterality Date  . Ganglion cyst (L) thumb    . MOUTH SURGERY    . VATS/Minithracotomy and bronch  04/2010    reports that she quit smoking about 40 years ago. Her smoking use included cigarettes. She has a 20.00 pack-year smoking history. she has never used smokeless tobacco. She reports that she does not drink alcohol or use drugs. family history includes Dementia in her other; Diabetes in her other; Hypertension in her other; Lung cancer in her other. Allergies  Allergen Reactions  . Zinc     Has implants   . Cephalosporins Rash   Current Outpatient Medications on File Prior to Visit  Medication Sig Dispense Refill  . Calcium Carbonate-Vitamin D (CALCIUM-D) 600-400 MG-UNIT TABS Take by mouth.    . Cholecalciferol (VITAMIN D-3) 1000 UNITS CAPS Take by mouth. Take 4800 units daily    . Lutein-Bilberry (LUTEIN PLUS BILBERRY PO) Take 20 mg by mouth daily.     . LUTEIN-ZEAXANTHIN PO Take by mouth daily.    .  Multiple Vitamin (MULTIVITAMIN) tablet Take 1 tablet by mouth daily.      . raloxifene (EVISTA) 60 MG tablet Take 1 tablet (60 mg total) by mouth daily. 90 tablet 2  . TURMERIC PO Take by mouth daily.     No current facility-administered medications on file prior to visit.    Review of Systems  Constitutional: Negative for other unusual diaphoresis or sweats HENT: Negative for ear discharge or swelling Eyes: Negative for other worsening visual disturbances Respiratory: Negative for stridor or other swelling  Gastrointestinal: Negative for worsening distension or other blood Genitourinary: Negative for retention or other urinary change Musculoskeletal: Negative for other MSK pain or swelling Skin: Negative for color change or other new lesions Neurological: Negative for worsening tremors and other numbness  Psychiatric/Behavioral: Negative for worsening agitation or other fatigue All other system neg per pt   Objective:   Physical Exam BP 112/72   Pulse 80   Temp 97.8 F (36.6 C) (Oral)   Ht 5\' 5"  (1.651 m)   Wt 122 lb (55.3 kg)   SpO2 98%   BMI 20.30 kg/m  VS noted,  Constitutional: Pt appears in NAD HENT: Head: NCAT.  Right Ear: External ear normal.  Left Ear: External ear normal.  Eyes: . Pupils are equal, round, and reactive to light. Conjunctivae and EOM are normal Nose: without d/c or deformity Neck: Neck supple. Gross normal ROM Cardiovascular: Normal rate  and regular rhythm.   Pulmonary/Chest: Effort normal and breath sounds without rales or wheezing.  Neurological: Pt is alert. At baseline orientation, motor grossly intact Skin: Skin is warm. No rashes, no LE edema but has 2 skin tear areas to the right mid anterior leg , each about .5 to 1 cm in size, both with partial roofs, no weepiness but has 3 cm area mild swelling and erythema without red streaks or abscess; leg o/w neurovasc intact Psychiatric: Pt behavior is normal without agitation , mild nervous, not  depressed affect No other exam findings  Lab Results  Component Value Date   WBC 6.2 04/18/2017   HGB 14.2 04/18/2017   HCT 43.1 04/18/2017   PLT 289.0 04/18/2017   GLUCOSE 100 (H) 04/18/2017   CHOL 204 (H) 04/18/2017   TRIG 74.0 04/18/2017   HDL 80.40 04/18/2017   LDLCALC 109 (H) 04/18/2017   ALT 11 04/18/2017   AST 16 04/18/2017   NA 143 04/18/2017   K 4.2 04/18/2017   CL 104 04/18/2017   CREATININE 0.92 04/18/2017   BUN 22 04/18/2017   CO2 32 04/18/2017   TSH 1.65 04/10/2014   INR 1.06 05/29/2010   HGBA1C 5.6 03/13/2012       Assessment & Plan:

## 2017-08-13 NOTE — Assessment & Plan Note (Signed)
Mild situational, cont same tx,  to f/u any worsening symptoms or concerns 

## 2017-08-13 NOTE — Assessment & Plan Note (Signed)
Mild to mod, for antibx course,  to f/u any worsening symptoms or concerns 

## 2017-08-13 NOTE — Patient Instructions (Addendum)
Please take all new medication as prescribed - the antibiotic  Please take the probiotic as well  You can also use Vaseline over the wounds, keep otherwise clean, warm and dry, and dry dressing change at least daily until healed  Please continue all other medications as before, and refills have been done if requested.  Please have the pharmacy call with any other refills you may need.  Please keep your appointments with your specialists as you may have planned

## 2017-08-29 ENCOUNTER — Encounter: Payer: Self-pay | Admitting: Internal Medicine

## 2017-08-29 ENCOUNTER — Ambulatory Visit: Payer: Medicare Other | Admitting: Internal Medicine

## 2017-08-29 DIAGNOSIS — R682 Dry mouth, unspecified: Secondary | ICD-10-CM

## 2017-08-29 NOTE — Assessment & Plan Note (Signed)
New medication of azithromycin which could be related. She denies other changes to meds or new otc meds. Will have her drink more water and use sugar free gum or lozenges to help. No medications indicated at this time. Next dental follow up in April.

## 2017-08-29 NOTE — Progress Notes (Signed)
   Subjective:    Patient ID: Terry HewsNancy B Lazo, female    DOB: 03/09/1934, 84 y.o.   MRN: [id number]  HPI The patient is an 84 YO female coming in for dry mouth. Going on for about 1-2 weeks. Started about 5 days after she took course of azithromycin. She denies problems while taking the pills. She denies fevers or chills. No mouth sores or ulcers. She had also been drinking more tea and has stopped drinking tea in the last several days. Overall stable since onset. She denies mouth pain or burning. She denies dental problems and sees dentist every 4 months.   Review of Systems  Constitutional: Negative.   HENT: Negative for congestion, ear discharge and ear pain.        Dry mouth  Eyes: Negative.   Respiratory: Negative for cough, chest tightness and shortness of breath.   Cardiovascular: Negative for chest pain, palpitations and leg swelling.  Gastrointestinal: Negative for abdominal distention, abdominal pain, constipation, diarrhea, nausea and vomiting.  Musculoskeletal: Negative.   Skin: Negative.   Neurological: Negative.   Psychiatric/Behavioral: Negative.       Objective:   Physical Exam  Constitutional: She is oriented to person, place, and time. She appears well-developed and well-nourished.  HENT:  Head: Normocephalic and atraumatic.  Tongue with some dryness, no ulcers or sores, no obvious dental infection or caries  Eyes: EOM are normal.  Neck: Normal range of motion.  Cardiovascular: Normal rate and regular rhythm.  Pulmonary/Chest: Effort normal and breath sounds normal. No respiratory distress. She has no wheezes. She has no rales.  Abdominal: Soft. Bowel sounds are normal. She exhibits no distension. There is no tenderness. There is no rebound.  Musculoskeletal: She exhibits no edema.  Neurological: She is alert and oriented to person, place, and time. Coordination normal.  Skin: Skin is warm and dry.   Vitals:   08/29/17 1116  BP: 104/68  Pulse: 68  Temp: 98.3 F  (36.8 C)  TempSrc: Oral  SpO2: 97%  Weight: 121 lb (54.9 kg)  Height: 5\' 5"  (1.651 m)      Assessment & Plan:

## 2017-08-29 NOTE — Patient Instructions (Signed)
You should do frequent sips of water. Avoid a lot of caffeine drinks like coffee or tea or soda.   You can use sugar free gum or lozenges to help stimulate saliva.  It is okay to try artificial saliva over the counter to help with dryness as needed.

## 2017-11-15 ENCOUNTER — Other Ambulatory Visit: Payer: Self-pay | Admitting: Internal Medicine

## 2018-02-24 ENCOUNTER — Telehealth: Payer: Self-pay | Admitting: Internal Medicine

## 2018-02-24 NOTE — Telephone Encounter (Signed)
Copied from CRM [id number]. Topic: Referral - Request >> Feb 24, 2018  4:06 PM Terry Robbins, Terry Robbins wrote: Reason for CRM: patient is requesting a referral  for a left hand (thumb) causing her some pain.  She stated she would like  call back with a update if this  Request is okay.  [facility], [name]   Address: [address], [place], [place] [address] Phone: [phone]

## 2018-02-24 NOTE — Telephone Encounter (Signed)
Patient scheduled with Jordan LikesSchmitz for upcoming Thursday for thumb pain and rehab referral.

## 2018-03-02 ENCOUNTER — Ambulatory Visit (INDEPENDENT_AMBULATORY_CARE_PROVIDER_SITE_OTHER): Payer: Medicare Other | Admitting: Family Medicine

## 2018-03-02 ENCOUNTER — Encounter: Payer: Self-pay | Admitting: Family Medicine

## 2018-03-02 VITALS — Ht 65.0 in | Wt 120.0 lb

## 2018-03-02 DIAGNOSIS — Z23 Encounter for immunization: Secondary | ICD-10-CM

## 2018-03-02 DIAGNOSIS — M79645 Pain in left finger(s): Secondary | ICD-10-CM | POA: Diagnosis not present

## 2018-03-02 DIAGNOSIS — G8929 Other chronic pain: Secondary | ICD-10-CM

## 2018-03-02 MED ORDER — RALOXIFENE HCL 60 MG PO TABS: 60.0000 mg | ORAL_TABLET | Freq: Every day | ORAL | 2 refills | Status: DC

## 2018-03-02 NOTE — Assessment & Plan Note (Signed)
Pain could be associated with the [facility] joint.  Less likely to be any de Quervain's. -Provided samples of Pennsaid. -Referral to physical therapy -If no improvement can consider injections or imaging.

## 2018-03-02 NOTE — Patient Instructions (Signed)
Good to meet you  Please try the medicine to rub on your thumb. We would have to give you more samples if you want more  They will call to get physical therapy set up.  Please see me back in 3-4 weeks if your pain doesn't improve.

## 2018-03-02 NOTE — Progress Notes (Signed)
Terry Robbins - 84 y.o. female MRN [id number]  Date of birth: 1934/03/27  SUBJECTIVE:  Including CC & ROS.  Chief Complaint  Patient presents with  . Hand Pain    Terry Robbins is a 84 y.o. female that is presenting with left thumb pain. Pain has been chronic in nature, increasing recently. She notices the pain when she is writing. She exercises daily with weights at the [facility]. She has been wearing a brace when she exercise and plays table tennis. Denies locking or giving way. She wants to have physical therapy for her thumb.  Pain is located near the [facility] joint.    Review of Systems  Constitutional: Negative for fever.  HENT: Negative for congestion.   Respiratory: Negative for cough.   Cardiovascular: Negative for chest pain.  Gastrointestinal: Negative for abdominal pain.  Musculoskeletal: Positive for arthralgias.  Skin: Negative for color change.    HISTORY: Past Medical, Surgical, Social, and Family History Reviewed & Updated per EMR.   Pertinent Historical Findings include:  Past Medical History:  Diagnosis Date  . ARTHRITIS   . Empyema, left (HCC) 04/2010   related to PNA; s/p VATS/minithoracotomy & bronch  . MACULAR DEGENERATION   . OA (osteoarthritis)   . OSTEOPOROSIS   . Psoriasis     Past Surgical History:  Procedure Laterality Date  . Ganglion cyst (L) thumb    . MOUTH SURGERY    . VATS/Minithracotomy and bronch  04/2010    Allergies  Allergen Reactions  . Zinc     Has implants   . Cephalosporins Rash    Family History  Problem Relation Age of Onset  . Diabetes Other        Parent  . Hypertension Other        parent  . Dementia Other        Parent  . Lung cancer Other        Parent  . Breast cancer Neg Hx      Social History   Socioeconomic History  . Marital status: Single    Spouse name: Not on file  . Number of children: Not on file  . Years of education: Not on file  . Highest education level: Not on file  Occupational History    . Not on file  Social Needs  . Financial resource strain: Not on file  . Food insecurity:    Worry: Not on file    Inability: Not on file  . Transportation needs:    Medical: Not on file    Non-medical: Not on file  Tobacco Use  . Smoking status: Former Smoker    Packs/day: 1.00    Years: 20.00    Pack years: 20.00    Types: Cigarettes    Last attempt to quit: 06/07/1977    Years since quitting: 40.7  . Smokeless tobacco: Never Used  Substance and Sexual Activity  . Alcohol use: No    Alcohol/week: 0.0 standard drinks  . Drug use: No  . Sexual activity: Not on file  Lifestyle  . Physical activity:    Days per week: Not on file    Minutes per session: Not on file  . Stress: Not on file  Relationships  . Social connections:    Talks on phone: Not on file    Gets together: Not on file    Attends religious service: Not on file    Active member of club or organization: Not on file  Attends meetings of clubs or organizations: Not on file    Relationship status: Not on file  . Intimate partner violence:    Fear of current or ex partner: Not on file    Emotionally abused: Not on file    Physically abused: Not on file    Forced sexual activity: Not on file  Other Topics Concern  . Not on file  Social History Narrative   Divorced, lives alone- Very active at [employer], church. retired from [employer] 2006     PHYSICAL EXAM:  VS: Ht 5\' 5"  (1.651 m)   Wt 120 lb (54.4 kg)   BMI 19.97 kg/m  Physical Exam Gen: NAD, alert, cooperative with exam, well-appearing ENT: normal lips, normal nasal mucosa,  Eye: normal EOM, normal conjunctiva and lids CV:  no edema, +2 pedal pulses   Resp: no accessory muscle use, non-labored,  Skin: no rashes, no areas of induration  Neuro: normal tone, normal sensation to touch Psych:  normal insight, alert and oriented MSK:  Left hand:  No signs of atrophy. Does have arthritic type changes of the [facility] joint and MP joint. No significant pain  with grinding of the [facility] joint. Normal strength resistance with thumb extension. Does have some pain with thumb opposition. Normal pincer grasp. Normal grip strength. Negative Finkelstein's test Neuro vascular intact     ASSESSMENT & PLAN:   Chronic pain of left thumb Pain could be associated with the [facility] joint.  Less likely to be any de Quervain's. -Provided samples of Pennsaid. -Referral to physical therapy -If no improvement can consider injections or imaging.

## 2018-06-14 ENCOUNTER — Ambulatory Visit (INDEPENDENT_AMBULATORY_CARE_PROVIDER_SITE_OTHER): Payer: Medicare Other | Admitting: Internal Medicine

## 2018-06-14 ENCOUNTER — Other Ambulatory Visit (INDEPENDENT_AMBULATORY_CARE_PROVIDER_SITE_OTHER): Payer: Medicare Other

## 2018-06-14 ENCOUNTER — Encounter: Payer: Self-pay | Admitting: Internal Medicine

## 2018-06-14 ENCOUNTER — Ambulatory Visit (INDEPENDENT_AMBULATORY_CARE_PROVIDER_SITE_OTHER): Payer: Medicare Other | Admitting: *Deleted

## 2018-06-14 VITALS — BP 120/70 | HR 59 | Temp 97.5°F | Ht 65.0 in | Wt 118.0 lb

## 2018-06-14 DIAGNOSIS — Z0000 Encounter for general adult medical examination without abnormal findings: Secondary | ICD-10-CM | POA: Diagnosis not present

## 2018-06-14 DIAGNOSIS — R3915 Urgency of urination: Secondary | ICD-10-CM | POA: Diagnosis not present

## 2018-06-14 DIAGNOSIS — H3530 Unspecified macular degeneration: Secondary | ICD-10-CM | POA: Diagnosis not present

## 2018-06-14 LAB — LIPID PANEL
Cholesterol: 180 mg/dL (ref 0–200)
HDL: 81.7 mg/dL (ref >39.00)
LDL Cholesterol: 87 mg/dL (ref 0–99)
NonHDL: 98.77
TRIGLYCERIDES: 58 mg/dL (ref 0.0–149.0)
Total CHOL/HDL Ratio: 2
VLDL: 11.6 mg/dL (ref 0.0–40.0)

## 2018-06-14 LAB — COMPREHENSIVE METABOLIC PANEL
ALT: 12 U/L (ref 0–35)
AST: 17 U/L (ref 0–37)
Albumin: 3.9 g/dL (ref 3.5–5.2)
Alkaline Phosphatase: 49 U/L (ref 39–117)
BILIRUBIN TOTAL: 0.5 mg/dL (ref 0.2–1.2)
BUN: 22 mg/dL (ref 6–23)
CALCIUM: 9.2 mg/dL (ref 8.4–10.5)
CO2: 30 meq/L (ref 19–32)
Chloride: 104 mEq/L (ref 96–112)
Creatinine, Ser: 0.93 mg/dL (ref 0.40–1.20)
GFR: 61.24 mL/min (ref >60.00)
GLUCOSE: 96 mg/dL (ref 70–99)
POTASSIUM: 4.1 meq/L (ref 3.5–5.1)
Sodium: 141 mEq/L (ref 135–145)
Total Protein: 6.7 g/dL (ref 6.0–8.3)

## 2018-06-14 LAB — CBC
HCT: 42.2 % (ref 36.0–46.0)
HEMOGLOBIN: 14.2 g/dL (ref 12.0–15.0)
MCHC: 33.6 g/dL (ref 30.0–36.0)
MCV: 89.2 fl (ref 78.0–100.0)
PLATELETS: 234 10*3/uL (ref 150.0–400.0)
RBC: 4.73 Mil/uL (ref 3.87–5.11)
RDW: 13.9 % (ref 11.5–15.5)
WBC: 6.4 10*3/uL (ref 4.0–10.5)

## 2018-06-14 MED ORDER — MIRABEGRON ER 50 MG PO TB24: 50.0000 mg | ORAL_TABLET | Freq: Every day | ORAL | 3 refills | Status: AC

## 2018-06-14 NOTE — Patient Instructions (Addendum)
We will check the labs today and call you back about the results.   We have sent in myrbetriq to take 1 pill daily to help the bladder.

## 2018-06-14 NOTE — Assessment & Plan Note (Signed)
Rx for myrbetriq to try for several episodes of large volume urine loss due to urgency. She does try to urinate on schedule so as not to have full bladder.

## 2018-06-14 NOTE — Assessment & Plan Note (Signed)
Flu shot up to date. Pneumonia complete. Shingrix counseled. Tetanus up to date. Colonoscopy aged out. Mammogram aged out, pap smear aged out and dexa up to date. Counseled about sun safety and mole surveillance. Counseled about the dangers of distracted driving. Given 10 year screening recommendations.  

## 2018-06-14 NOTE — Assessment & Plan Note (Signed)
Still doing injections now every 7 weeks.

## 2018-06-14 NOTE — Progress Notes (Signed)
Subjective:   Terry Robbins is a 85 y.o. female who presents for Medicare Annual (Subsequent) preventive examination.  Review of Systems:  No ROS.  Medicare Wellness Visit. Additional risk factors are reflected in the social history.  Cardiac Risk Factors include: none Sleep patterns: no sleep issues, feels rested on waking, does not get up to void frequently.  Home Safety/Smoke Alarms: Feels safe in home. Smoke alarms in place.  Living environment; residence and Firearm Safety: 1-story house/ trailer. Lives alone, no needs for DME, good support system  Seat Belt Safety/Bike Helmet: Wears seat belt.       Objective:     Vitals: BP 120/70   Pulse (!) 59   Temp (!) 97.5 F (36.4 C)   Ht 5\' 5"  (1.651 m)   Wt 118 lb (53.5 kg)   BMI 19.64 kg/m   Body mass index is 19.64 kg/m.  Advanced Directives 06/14/2018 10/30/2015  Does Patient Have a Medical Advance Directive? Yes Yes  Type of Advance Directive Living will;Healthcare Power of Attorney -  Copy of Healthcare Power of Attorney in Chart? No - copy requested Yes    Tobacco Social History   Tobacco Use  Smoking Status Former Smoker  . Packs/day: 1.00  . Years: 20.00  . Pack years: 20.00  . Types: Cigarettes  . Last attempt to quit: 06/07/1977  . Years since quitting: 41.0  Smokeless Tobacco Never Used     Counseling given: Not Answered    Past Medical History:  Diagnosis Date  . ARTHRITIS   . Empyema, left (HCC) 04/2010   related to PNA; s/p VATS/minithoracotomy & bronch  . MACULAR DEGENERATION   . OA (osteoarthritis)   . OSTEOPOROSIS   . Psoriasis    Past Surgical History:  Procedure Laterality Date  . Ganglion cyst (L) thumb    . MOUTH SURGERY    . VATS/Minithracotomy and bronch  04/2010   Family History  Problem Relation Age of Onset  . Diabetes Other        Parent  . Hypertension Other        parent  . Dementia Other        Parent  . Lung cancer Other        Parent  . Breast cancer Neg Hx     Social History   Socioeconomic History  . Marital status: Divorced    Spouse name: Not on file  . Number of children: Not on file  . Years of education: Not on file  . Highest education level: Not on file  Occupational History  . Not on file  Social Needs  . Financial resource strain: Not hard at all  . Food insecurity:    Worry: Never true    Inability: Never true  . Transportation needs:    Medical: No    Non-medical: No  Tobacco Use  . Smoking status: Former Smoker    Packs/day: 1.00    Years: 20.00    Pack years: 20.00    Types: Cigarettes    Last attempt to quit: 06/07/1977    Years since quitting: 41.0  . Smokeless tobacco: Never Used  Substance and Sexual Activity  . Alcohol use: No    Alcohol/week: 0.0 standard drinks  . Drug use: No  . Sexual activity: Not Currently  Lifestyle  . Physical activity:    Days per week: 4 days    Minutes per session: 60 min  . Stress: Not at all  Relationships  .  Social connections:    Talks on phone: More than three times a week    Gets together: More than three times a week    Attends religious service: Not on file    Active member of club or organization: Yes    Attends meetings of clubs or organizations: More than 4 times per year    Relationship status: Divorced  Other Topics Concern  . Not on file  Social History Narrative   Divorced, lives alone- Very active at [employer], church. retired from [employer] 2006    Outpatient Encounter Medications as of 06/14/2018  Medication Sig  . Calcium Carbonate-Vitamin D (CALCIUM-D) 600-400 MG-UNIT TABS Take by mouth.  . Cholecalciferol (VITAMIN D-3) 1000 UNITS CAPS Take by mouth. Take 4800 units daily  . Lutein-Bilberry (LUTEIN PLUS BILBERRY PO) Take 20 mg by mouth daily.   . LUTEIN-ZEAXANTHIN PO Take by mouth daily.  . Multiple Vitamin (MULTIVITAMIN) tablet Take 1 tablet by mouth daily.    . raloxifene (EVISTA) 60 MG tablet Take 1 tablet (60 mg total) by mouth daily.  . TURMERIC  PO Take by mouth daily.   No facility-administered encounter medications on file as of 06/14/2018.     Activities of Daily Living In your present state of health, do you have any difficulty performing the following activities: 06/14/2018  Hearing? Y  Vision? N  Difficulty concentrating or making decisions? N  Walking or climbing stairs? N  Dressing or bathing? N  Doing errands, shopping? N  Preparing Food and eating ? N  Using the Toilet? N  In the past six months, have you accidently leaked urine? Y  Comment PCP aware; pt actively managing  Do you have problems with loss of bowel control? N  Managing your Medications? N  Managing your Finances? N  Housekeeping or managing your Housekeeping? N  Some recent data might be hidden    Patient Care Team: Myrlene Brokerrawford, Elizabeth A, MD as PCP - General (Internal Medicine) Maris BergerMcCuen, Christine, MD as Consulting Physician (Ophthalmology) Carman ChingEdwards, James, MD (Gastroenterology)    Assessment:   This is a routine wellness examination for TabionaNancy. Physical assessment deferred to PCP.   Exercise Activities and Dietary recommendations Current Exercise Habits: Structured exercise class, Type of exercise: Other - see comments(table tennis, aerobics), Time (Minutes): 60, Frequency (Times/Week): 4, Weekly Exercise (Minutes/Week): 240, Intensity: Moderate, Exercise limited by: None identified Diet (meal preparation, eat out, water intake, caffeinated beverages, dairy products, fruits and vegetables): in general, a "healthy" diet  , on average, 2 meals per day. Encouraged patient to increase daily water and healthy fluid intake.  Goals    . patient     Taking care of your eyes     . Patient Stated     "Maintain good eye care for R eye, Follow-up with Dr. Allena KatzPatel for injection, next appointment already scheduled 1/28". "Maintain current health status"       Fall Risk Fall Risk  06/14/2018 06/14/2018 04/18/2017 10/30/2015 04/14/2015  Falls in the past year? 0 0  No Yes No  Number falls in past yr: - - - 1 -  Injury with Fall? - - - No -  Risk for fall due to : Impaired vision - - - -  Follow up Falls evaluation completed - - (No Data) -  Comment - - - fell playing ping pong -    Depression Screen PHQ 2/9 Scores 06/14/2018 06/14/2018 06/14/2018 04/18/2017  PHQ - 2 Score 0 0 1 0  Cognitive Function MMSE - Mini Mental State Exam 10/30/2015  Not completed: (No Data)        Ad8 score reviewed for issues:  Issues making decisions: no  Less interest in hobbies / activities: no  Repeats questions, stories (family complaining): no  Trouble using ordinary gadgets (microwave, computer, phone):no  Forgets the month or year: no  Mismanaging finances: no  Remembering appts: no  Daily problems with thinking and/or memory: no Ad8 score is= 0    Immunization History  Administered Date(s) Administered  . Influenza Split 03/25/2011, 03/10/2012  . Influenza, High Dose Seasonal PF 04/05/2013, 04/10/2014, 03/13/2015, 03/25/2017, 03/02/2018  . Influenza,inj,Quad PF,6+ Mos 04/10/2016  . Pneumococcal Conjugate-13 05/21/2013  . Pneumococcal Polysaccharide-23 04/14/2015  . Tdap 04/14/2015  . Zoster 06/07/2004    Qualifies for Shingles Vaccine? VIS sheet given.  Screening Tests Health Maintenance  Topic Date Due  . TETANUS/TDAP  04/13/2025  . INFLUENZA VACCINE  Completed  . DEXA SCAN  Completed  . PNA vac Low Risk Adult  Completed        Plan:    Continue doing brain stimulating activities (puzzles, reading, adult coloring books, staying active) to keep memory sharp.   Advised to get shingrix vaccination at local pharmacy.  [facility] and class at [address] on urinary incontinence. Continue scheduled toileting, and hydrating well.  I have personally reviewed and noted the following in the patient's chart:   . Medical and social history . Use of alcohol, tobacco or illicit drugs  . Current  medications and supplements . Functional ability and status . Nutritional status . Physical activity . Advanced directives . List of other physicians . Vitals . Screenings to include cognitive, depression, and falls . Referrals and appointments  In addition, I have reviewed and discussed with patient certain preventive protocols, quality metrics, and best practice recommendations. A written personalized care plan for preventive services as well as general preventive health recommendations were provided to patient.     Brayton LaymanEmily R , RN  06/14/2018

## 2018-06-14 NOTE — Progress Notes (Signed)
   Subjective:   Patient ID: Terry Robbins, female    DOB: 06/21/1933, 85 y.o.   MRN: [id number]  HPI The patient is an 85 YO female coming in for physical.   PMH, FMH, social history reviewed and updated  Review of Systems  Constitutional: Negative.   HENT: Negative.   Eyes: Negative.   Respiratory: Negative for cough, chest tightness and shortness of breath.   Cardiovascular: Negative for chest pain, palpitations and leg swelling.  Gastrointestinal: Negative for abdominal distention, abdominal pain, constipation, diarrhea, nausea and vomiting.  Musculoskeletal: Negative.   Skin: Negative.   Neurological: Negative.   Psychiatric/Behavioral: Negative.     Objective:  Physical Exam Constitutional:      Appearance: She is well-developed.  HENT:     Head: Normocephalic and atraumatic.  Neck:     Musculoskeletal: Normal range of motion.  Cardiovascular:     Rate and Rhythm: Normal rate and regular rhythm.  Pulmonary:     Effort: Pulmonary effort is normal. No respiratory distress.     Breath sounds: Normal breath sounds. No wheezing or rales.  Abdominal:     General: Bowel sounds are normal. There is no distension.     Palpations: Abdomen is soft.     Tenderness: There is no abdominal tenderness. There is no rebound.  Skin:    General: Skin is warm and dry.  Neurological:     Mental Status: She is alert and oriented to person, place, and time.     Coordination: Coordination normal.     Vitals:   06/14/18 0906  BP: 120/70  Pulse: (!) 59  Temp: (!) 97.5 F (36.4 C)  TempSrc: Oral  SpO2: 96%  Weight: 118 lb (53.5 kg)  Height: 5\' 5"  (1.651 m)    Assessment & Plan:

## 2018-06-14 NOTE — Patient Instructions (Addendum)
Terry Robbins , Thank you for taking time to come for your Medicare Wellness Visit. I appreciate your ongoing commitment to your health goals. Please review the following plan we discussed and let me know if I can assist you in the future.   These are the goals we discussed: Goals    . patient     Taking care of your eyes     . Patient Stated     "Maintain good eye care for R eye, Follow-up with Dr. Posey Pronto for injection, next appointment already scheduled 1/28". "Maintain current health status"       This is a list of the screening recommended for you and due dates:  Health Maintenance  Topic Date Due  . Tetanus Vaccine  04/13/2025  . Flu Shot  Completed  . DEXA scan (bone density measurement)  Completed  . Pneumonia vaccines  Completed     Continue doing brain stimulating activities (puzzles, reading, adult coloring books, staying active) to keep memory sharp.   Advised to get shingrix vaccination at local pharmacy.  Continue taking classes at the [facility] and class at [address] on urinary incontinence. Continue scheduled toileting, and hydrating well.    Fall Prevention in the Home, Adult Falls can cause injuries. They can happen to people of all ages. There are many things you can do to make your home safe and to help prevent falls. Ask for help when making these changes, if needed. What actions can I take to prevent falls? General Instructions  Use good lighting in all rooms. Replace any light bulbs that burn out.  Turn on the lights when you go into a dark area. Use night-lights.  Keep items that you use often in easy-to-reach places. Lower the shelves around your home if necessary.  Set up your furniture so you have a clear path. Avoid moving your furniture around.  Do not have throw rugs and other things on the floor that can make you trip.  Avoid walking on wet floors.  If any of your floors are uneven, fix them.  Add color or contrast paint or tape to  clearly mark and help you see: ? Any grab bars or handrails. ? First and last steps of stairways. ? Where the edge of each step is.  If you use a stepladder: ? Make sure that it is fully opened. Do not climb a closed stepladder. ? Make sure that both sides of the stepladder are locked into place. ? Ask someone to hold the stepladder for you while you use it.  If there are any pets around you, be aware of where they are. What can I do in the bathroom?      Keep the floor dry. Clean up any water that spills onto the floor as soon as it happens.  Remove soap buildup in the tub or shower regularly.  Use non-skid mats or decals on the floor of the tub or shower.  Attach bath mats securely with double-sided, non-slip rug tape.  If you need to sit down in the shower, use a plastic, non-slip stool.  Install grab bars by the toilet and in the tub and shower. Do not use towel bars as grab bars. What can I do in the bedroom?  Make sure that you have a light by your bed that is easy to reach.  Do not use any sheets or blankets that are too big for your bed. They should not hang down onto the floor.  Have  a firm chair that has side arms. You can use this for support while you get dressed. What can I do in the kitchen?  Clean up any spills right away.  If you need to reach something above you, use a strong step stool that has a grab bar.  Keep electrical cords out of the way.  Do not use floor polish or wax that makes floors slippery. If you must use wax, use non-skid floor wax. What can I do with my stairs?  Do not leave any items on the stairs.  Make sure that you have a light switch at the top of the stairs and the bottom of the stairs. If you do not have them, ask someone to add them for you.  Make sure that there are handrails on both sides of the stairs, and use them. Fix handrails that are broken or loose. Make sure that handrails are as long as the stairways.  Install  non-slip stair treads on all stairs in your home.  Avoid having throw rugs at the top or bottom of the stairs. If you do have throw rugs, attach them to the floor with carpet tape.  Choose a carpet that does not hide the edge of the steps on the stairway.  Check any carpeting to make sure that it is firmly attached to the stairs. Fix any carpet that is loose or worn. What can I do on the outside of my home?  Use bright outdoor lighting.  Regularly fix the edges of walkways and driveways and fix any cracks.  Remove anything that might make you trip as you walk through a door, such as a raised step or threshold.  Trim any bushes or trees on the path to your home.  Regularly check to see if handrails are loose or broken. Make sure that both sides of any steps have handrails.  Install guardrails along the edges of any raised decks and porches.  Clear walking paths of anything that might make someone trip, such as tools or rocks.  Have any leaves, snow, or ice cleared regularly.  Use sand or salt on walking paths during winter.  Clean up any spills in your garage right away. This includes grease or oil spills. What other actions can I take?  Wear shoes that: ? Have a low heel. Do not wear high heels. ? Have rubber bottoms. ? Are comfortable and fit you well. ? Are closed at the toe. Do not wear open-toe sandals.  Use tools that help you move around (mobility aids) if they are needed. These include: ? Canes. ? Walkers. ? Scooters. ? Crutches.  Review your medicines with your doctor. Some medicines can make you feel dizzy. This can increase your chance of falling. Ask your doctor what other things you can do to help prevent falls. Where to find more information  Centers for Disease Control and Prevention, STEADI: https://garcia.biz/  [employer] on Aging: BrainJudge.co.uk Contact a doctor if:  You are afraid of falling at home.  You feel weak, drowsy, or  dizzy at home.  You fall at home. Summary  There are many simple things that you can do to make your home safe and to help prevent falls.  Ways to make your home safe include removing tripping hazards and installing grab bars in the bathroom.  Ask for help when making these changes in your home. This information is not intended to replace advice given to you by your health care provider. Make sure  you discuss any questions you have with your health care provider. Document Released: 03/20/2009 Document Revised: 01/06/2017 Document Reviewed: 01/06/2017 Elsevier Interactive Patient Education  2019 [place] Maintenance, Female Adopting a healthy lifestyle and getting preventive care can go a long way to promote health and wellness. Talk with your health care provider about what schedule of regular examinations is right for you. This is a good chance for you to check in with your provider about disease prevention and staying healthy. In between checkups, there are plenty of things you can do on your own. Experts have done a lot of research about which lifestyle changes and preventive measures are most likely to keep you healthy. Ask your health care provider for more information. Weight and diet Eat a healthy diet  Be sure to include plenty of vegetables, fruits, low-fat dairy products, and lean protein.  Do not eat a lot of foods high in solid fats, added sugars, or salt.  Get regular exercise. This is one of the most important things you can do for your health. ? Most adults should exercise for at least 150 minutes each week. The exercise should increase your heart rate and make you sweat (moderate-intensity exercise). ? Most adults should also do strengthening exercises at least twice a week. This is in addition to the moderate-intensity exercise. Maintain a healthy weight  Body mass index (BMI) is a measurement that can be used to identify possible weight problems. It  estimates body fat based on height and weight. Your health care provider can help determine your BMI and help you achieve or maintain a healthy weight.  For females 72 years of age and older: ? A BMI below 18.5 is considered underweight. ? A BMI of 18.5 to 24.9 is normal. ? A BMI of 25 to 29.9 is considered overweight. ? A BMI of 30 and above is considered obese. Watch levels of cholesterol and blood lipids  You should start having your blood tested for lipids and cholesterol at 83 years of age, then have this test every 5 years.  You may need to have your cholesterol levels checked more often if: ? Your lipid or cholesterol levels are high. ? You are older than 83 years of age. ? You are at high risk for heart disease. Cancer screening Lung Cancer  Lung cancer screening is recommended for adults 8-62 years old who are at high risk for lung cancer because of a history of smoking.  A yearly low-dose CT scan of the lungs is recommended for people who: ? Currently smoke. ? Have quit within the past 15 years. ? Have at least a 30-pack-year history of smoking. A pack year is smoking an average of one pack of cigarettes a day for 1 year.  Yearly screening should continue until it has been 15 years since you quit.  Yearly screening should stop if you develop a health problem that would prevent you from having lung cancer treatment. Breast Cancer  Practice breast self-awareness. This means understanding how your breasts normally appear and feel.  It also means doing regular breast self-exams. Let your health care provider know about any changes, no matter how small.  If you are in your 20s or 30s, you should have a clinical breast exam (CBE) by a health care provider every 1-3 years as part of a regular health exam.  If you are 15 or older, have a CBE every year. Also consider having a breast X-ray (mammogram) every year.  If you have a family history of breast cancer, talk to your  health care provider about genetic screening.  If you are at high risk for breast cancer, talk to your health care provider about having an MRI and a mammogram every year.  Breast cancer gene (BRCA) assessment is recommended for women who have family members with BRCA-related cancers. BRCA-related cancers include: ? Breast. ? Ovarian. ? Tubal. ? Peritoneal cancers.  Results of the assessment will determine the need for genetic counseling and BRCA1 and BRCA2 testing. Cervical Cancer Your health care provider may recommend that you be screened regularly for cancer of the pelvic organs (ovaries, uterus, and vagina). This screening involves a pelvic examination, including checking for microscopic changes to the surface of your cervix (Pap test). You may be encouraged to have this screening done every 3 years, beginning at age 27.  For women ages 36-65, health care providers may recommend pelvic exams and Pap testing every 3 years, or they may recommend the Pap and pelvic exam, combined with testing for human papilloma virus (HPV), every 5 years. Some types of HPV increase your risk of cervical cancer. Testing for HPV may also be done on women of any age with unclear Pap test results.  Other health care providers may not recommend any screening for nonpregnant women who are considered low risk for pelvic cancer and who do not have symptoms. Ask your health care provider if a screening pelvic exam is right for you.  If you have had past treatment for cervical cancer or a condition that could lead to cancer, you need Pap tests and screening for cancer for at least 20 years after your treatment. If Pap tests have been discontinued, your risk factors (such as having a new sexual partner) need to be reassessed to determine if screening should resume. Some women have medical problems that increase the chance of getting cervical cancer. In these cases, your health care provider may recommend more frequent  screening and Pap tests. Colorectal Cancer  This type of cancer can be detected and often prevented.  Routine colorectal cancer screening usually begins at 82 years of age and continues through 83 years of age.  Your health care provider may recommend screening at an earlier age if you have risk factors for colon cancer.  Your health care provider may also recommend using home test kits to check for hidden blood in the stool.  A small camera at the end of a tube can be used to examine your colon directly (sigmoidoscopy or colonoscopy). This is done to check for the earliest forms of colorectal cancer.  Routine screening usually begins at age 58.  Direct examination of the colon should be repeated every 5-10 years through 83 years of age. However, you may need to be screened more often if early forms of precancerous polyps or small growths are found. Skin Cancer  Check your skin from head to toe regularly.  Tell your health care provider about any new moles or changes in moles, especially if there is a change in a mole's shape or color.  Also tell your health care provider if you have a mole that is larger than the size of a pencil eraser.  Always use sunscreen. Apply sunscreen liberally and repeatedly throughout the day.  Protect yourself by wearing long sleeves, pants, a wide-brimmed hat, and sunglasses whenever you are outside. Heart disease, diabetes, and high blood pressure  High blood pressure causes heart disease and increases the risk of  stroke. High blood pressure is more likely to develop in: ? People who have blood pressure in the high end of the normal range (130-139/85-89 mm Hg). ? People who are overweight or obese. ? People who are African American.  If you are 45-82 years of age, have your blood pressure checked every 3-5 years. If you are 69 years of age or older, have your blood pressure checked every year. You should have your blood pressure measured twice-once  when you are at a hospital or clinic, and once when you are not at a hospital or clinic. Record the average of the two measurements. To check your blood pressure when you are not at a hospital or clinic, you can use: ? An automated blood pressure machine at a pharmacy. ? A home blood pressure monitor.  If you are between 91 years and 30 years old, ask your health care provider if you should take aspirin to prevent strokes.  Have regular diabetes screenings. This involves taking a blood sample to check your fasting blood sugar level. ? If you are at a normal weight and have a low risk for diabetes, have this test once every three years after 83 years of age. ? If you are overweight and have a high risk for diabetes, consider being tested at a younger age or more often. Preventing infection Hepatitis B  If you have a higher risk for hepatitis B, you should be screened for this virus. You are considered at high risk for hepatitis B if: ? You were born in a country where hepatitis B is common. Ask your health care provider which countries are considered high risk. ? Your parents were born in a high-risk country, and you have not been immunized against hepatitis B (hepatitis B vaccine). ? You have HIV or AIDS. ? You use needles to inject street drugs. ? You live with someone who has hepatitis B. ? You have had sex with someone who has hepatitis B. ? You get hemodialysis treatment. ? You take certain medicines for conditions, including cancer, organ transplantation, and autoimmune conditions. Hepatitis C  Blood testing is recommended for: ? Everyone born from [birth year] through 1963. ? Anyone with known risk factors for hepatitis C. Sexually transmitted infections (STIs)  You should be screened for sexually transmitted infections (STIs) including gonorrhea and chlamydia if: ? You are sexually active and are younger than 83 years of age. ? You are older than 83 years of age and your health care  provider tells you that you are at risk for this type of infection. ? Your sexual activity has changed since you were last screened and you are at an increased risk for chlamydia or gonorrhea. Ask your health care provider if you are at risk.  If you do not have HIV, but are at risk, it may be recommended that you take a prescription medicine daily to prevent HIV infection. This is called pre-exposure prophylaxis (PrEP). You are considered at risk if: ? You are sexually active and do not regularly use condoms or know the HIV status of your partner(s). ? You take drugs by injection. ? You are sexually active with a partner who has HIV. Talk with your health care provider about whether you are at high risk of being infected with HIV. If you choose to begin PrEP, you should first be tested for HIV. You should then be tested every 3 months for as long as you are taking PrEP. Pregnancy  If you are premenopausal  and you may become pregnant, ask your health care provider about preconception counseling.  If you may become pregnant, take 400 to 800 micrograms (mcg) of folic acid every day.  If you want to prevent pregnancy, talk to your health care provider about birth control (contraception). Osteoporosis and menopause  Osteoporosis is a disease in which the bones lose minerals and strength with aging. This can result in serious bone fractures. Your risk for osteoporosis can be identified using a bone density scan.  If you are 7 years of age or older, or if you are at risk for osteoporosis and fractures, ask your health care provider if you should be screened.  Ask your health care provider whether you should take a calcium or vitamin D supplement to lower your risk for osteoporosis.  Menopause may have certain physical symptoms and risks.  Hormone replacement therapy may reduce some of these symptoms and risks. Talk to your health care provider about whether hormone replacement therapy is right  for you. Follow these instructions at home:  Schedule regular health, dental, and eye exams.  Stay current with your immunizations.  Do not use any tobacco products including cigarettes, chewing tobacco, or electronic cigarettes.  If you are pregnant, do not drink alcohol.  If you are breastfeeding, limit how much and how often you drink alcohol.  Limit alcohol intake to no more than 1 drink per day for nonpregnant women. One drink equals 12 ounces of beer, 5 ounces of wine, or 1 ounces of hard liquor.  Do not use street drugs.  Do not share needles.  Ask your health care provider for help if you need support or information about quitting drugs.  Tell your health care provider if you often feel depressed.  Tell your health care provider if you have ever been abused or do not feel safe at home. This information is not intended to replace advice given to you by your health care provider. Make sure you discuss any questions you have with your health care provider. Document Released: 12/07/2010 Document Revised: 10/30/2015 Document Reviewed: 02/25/2015 Elsevier Interactive Patient Education  2019 [employer].

## 2018-06-15 NOTE — Progress Notes (Signed)
Medical screening examination/treatment/procedure(s) were performed by non-physician practitioner and as supervising physician I was immediately available for consultation/collaboration. I agree with above. Elizabeth A Crawford, MD 

## 2018-07-31 ENCOUNTER — Other Ambulatory Visit: Payer: Self-pay | Admitting: Internal Medicine

## 2018-07-31 DIAGNOSIS — Z1231 Encounter for screening mammogram for malignant neoplasm of breast: Secondary | ICD-10-CM

## 2018-08-01 ENCOUNTER — Ambulatory Visit
Admission: RE | Admit: 2018-08-01 | Discharge: 2018-08-01 | Disposition: A | Discharge status: Home / Self Care | Payer: Medicare Other | Source: Ambulatory Visit | Attending: Internal Medicine | Admitting: Internal Medicine

## 2018-08-01 DIAGNOSIS — Z1231 Encounter for screening mammogram for malignant neoplasm of breast: Secondary | ICD-10-CM

## 2018-09-20 ENCOUNTER — Telehealth (INDEPENDENT_AMBULATORY_CARE_PROVIDER_SITE_OTHER): Payer: Medicare Other | Admitting: Internal Medicine

## 2018-09-20 DIAGNOSIS — F411 Generalized anxiety disorder: Secondary | ICD-10-CM | POA: Diagnosis not present

## 2018-09-20 DIAGNOSIS — R109 Unspecified abdominal pain: Secondary | ICD-10-CM

## 2018-09-20 NOTE — Telephone Encounter (Signed)
Patient is calling stating she has gas pain.  States she has a bowel movement every day.  States she has tried taking probiotics but this is not working.  Patient does not have access to a V visit.  Patient would like to know if she could do a phone visit.   Please advise with Dr. Okey Dupre out of the office.   Patient states she uses [facility] on [address].

## 2018-09-20 NOTE — Telephone Encounter (Signed)
Schedule a phone visit

## 2018-09-20 NOTE — Telephone Encounter (Signed)
Telephone visit.  Cumulative time during 7-day interval 20 minutes, there was not an associated office visit for this concern within a 7 day period.  Verbal consent for services obtained from patient prior to services given.  Names of all persons present for services: Pincus Sanes, MD, Kandyce Rud   Chief complaint: abdominal pain  She states her abdominal pain started about 1 week ago.  She describes the pain is a cramping sensation that is intermittent and it is across the lower abdomen.  She has the pain during the day, but if she falls asleep on the couch during the day or when she sleeps at night she has no pain.  If she passes gas she does get a little bit of relief, but not complete relief.  Sitting in the shower with a hot water on her abdomen did seem to help a little.  Her bowel movements have been completely normal through all of this.  She denies any fevers, chills, heartburn, nausea or blood in the stool.  She denies a history of diverticulosis/-itis.  She denies any urinary symptoms and denies a history of UTIs.  She denies a history of IBS.  Recently she has been drinking too much tea and has not been eating normally and she thinks this could be contributing.  She has been eating more junk food to keep her weight up, which she typically does not do.  She did stop drinking tea and is only drinking water a couple of days ago.  She is also decreased the junk food.  She has not seen much improvement, but she just did this a couple of days ago.  She also feels anxiety is likely contributing.  She has been very anxious about everything that is going on.  She lives alone and has no family in the area.  She feels she is doing okay, but definitely feels the anxiety.  She has not been exercising as much as usual because she does not go outside much because of the pollen.  She is taking probiotics.  History, background, results pertinent:  Past Medical History:  Diagnosis Date  .  ARTHRITIS   . Empyema, left (HCC) 04/2010   related to PNA; s/p VATS/minithoracotomy & bronch  . MACULAR DEGENERATION   . OA (osteoarthritis)   . OSTEOPOROSIS   . Psoriasis     A/P/next steps: abdominal pain  She has intermittent, cramping lower abdominal pain without any other symptoms.  The pain resolves if she sleeps during the day or at night.  I agree that there could be an anxiety component in addition to her diet.  She is already made changes to her diet and will continue those changes.  She decreased junk food and is only drinking water.  Advised a bland diet, small meals.  Discussed that anxiety is likely contributing to some of her symptoms.  Encouraged her to exercise more regularly, even if this is inside.  Discussed the importance of doing other things that may help relieve some of the stress/anxiety.  Discussed that it is difficult to rule certain things that without seeing her in the office.  Discussed that if these measures above do not improve her pain she should call in the next day or 2 and we can see her in the office with all of the COVID precautions.  She agrees with plan and feels better.  She will call in the next day or 2 if her symptoms worsen or do not improve that  we can potentially see her in the office.

## 2018-09-20 NOTE — Telephone Encounter (Signed)
I have blocked your schedule at 2:00 to call

## 2018-11-24 ENCOUNTER — Telehealth: Payer: Self-pay | Admitting: Internal Medicine

## 2018-11-24 DIAGNOSIS — M79645 Pain in left finger(s): Secondary | ICD-10-CM

## 2018-11-24 DIAGNOSIS — G8929 Other chronic pain: Secondary | ICD-10-CM

## 2018-11-24 MED ORDER — RALOXIFENE HCL 60 MG PO TABS: 60.0000 mg | ORAL_TABLET | Freq: Every day | ORAL | 2 refills | Status: DC

## 2018-11-24 NOTE — Telephone Encounter (Signed)
raloxifene (EVISTA) 60 MG tablet  [facility] [place] [facility]

## 2018-11-24 NOTE — Telephone Encounter (Signed)
Okay to refill? 

## 2018-11-24 NOTE — Telephone Encounter (Signed)
Yes, okay to fill

## 2019-02-19 ENCOUNTER — Ambulatory Visit (INDEPENDENT_AMBULATORY_CARE_PROVIDER_SITE_OTHER): Payer: Medicare Other | Admitting: Internal Medicine

## 2019-02-19 ENCOUNTER — Encounter: Payer: Self-pay | Admitting: Internal Medicine

## 2019-02-19 ENCOUNTER — Other Ambulatory Visit: Payer: Self-pay

## 2019-02-19 VITALS — BP 120/80 | HR 73 | Temp 97.6°F | Ht 65.0 in | Wt 110.0 lb

## 2019-02-19 DIAGNOSIS — Z7189 Other specified counseling: Secondary | ICD-10-CM | POA: Diagnosis not present

## 2019-02-19 DIAGNOSIS — F411 Generalized anxiety disorder: Secondary | ICD-10-CM | POA: Diagnosis not present

## 2019-02-19 DIAGNOSIS — Z23 Encounter for immunization: Secondary | ICD-10-CM | POA: Diagnosis not present

## 2019-02-19 NOTE — Patient Instructions (Signed)
Prevagen is over the counter to help with memory.

## 2019-02-19 NOTE — Progress Notes (Signed)
   Subjective:   Patient ID: Terry Robbins, female    DOB: 1933/08/25, 85 y.o.   MRN: [id number]  HPI The patient is an 85 YO female coming in for irritation in her stomach. This was going on the last week or so but is improving and is gone now. She still came in as she is having a lot of stress with the pandemic and election and was needing some coping skills. She is trying to limit news but this is not helping as much as she thought it would. Denies fevers or chills. Denies SI/HI. Denies feeling depressed or wanting to take medicine for this.   Review of Systems  Constitutional: Negative.   HENT: Negative.   Eyes: Negative.   Respiratory: Negative for cough, chest tightness and shortness of breath.   Cardiovascular: Negative for chest pain, palpitations and leg swelling.  Gastrointestinal: Negative for abdominal distention, abdominal pain, constipation, diarrhea, nausea and vomiting.  Musculoskeletal: Negative.   Skin: Negative.   Neurological: Negative.   Psychiatric/Behavioral: Positive for decreased concentration and dysphoric mood.    Objective:  Physical Exam Constitutional:      Appearance: She is well-developed.  HENT:     Head: Normocephalic and atraumatic.  Neck:     Musculoskeletal: Normal range of motion.  Cardiovascular:     Rate and Rhythm: Normal rate and regular rhythm.  Pulmonary:     Effort: Pulmonary effort is normal. No respiratory distress.     Breath sounds: Normal breath sounds. No wheezing or rales.  Abdominal:     General: Bowel sounds are normal. There is no distension.     Palpations: Abdomen is soft.     Tenderness: There is no abdominal tenderness. There is no rebound.  Skin:    General: Skin is warm and dry.  Neurological:     Mental Status: She is alert and oriented to person, place, and time.     Coordination: Coordination normal.     Vitals:   02/19/19 0901  BP: 120/80  Pulse: 73  Temp: 97.6 F (36.4 C)  TempSrc: Oral  SpO2: 96%   Weight: 110 lb (49.9 kg)  Height: 5\' 5"  (1.651 m)   Visit time 25 minutes: greater than 50% of that time was spent in face to face counseling and coordination of care with the patient: counseled about covid-19 outbreak, ways to stay safe, coping skills for stress  Assessment & Plan:  Flu shot given at visit

## 2019-02-21 ENCOUNTER — Encounter: Payer: Self-pay | Admitting: Internal Medicine

## 2019-02-21 DIAGNOSIS — Z7189 Other specified counseling: Secondary | ICD-10-CM | POA: Insufficient documentation

## 2019-02-21 NOTE — Assessment & Plan Note (Signed)
Counseled on strategies to prevent infection and reduce risk. Answered questions.

## 2019-02-21 NOTE — Assessment & Plan Note (Signed)
Offered coping skills and advised considering counseling to help also. She declines medication at this time. No SI/HI.

## 2019-04-25 ENCOUNTER — Telehealth: Payer: Self-pay

## 2019-04-25 ENCOUNTER — Telehealth: Payer: Self-pay | Admitting: *Deleted

## 2019-04-25 NOTE — Telephone Encounter (Signed)
Patient states that she has [facility] and they come out to her house to do what I believe is wellness visit the way she was explaining what they did. states that they are not sending [place] the results and wants [place] to know that she was upset that they would not send it. Would like to know if she can just have the wellness visits done with [place] I explained that I would let our Nurse Sharee Pimple know that she wanted to start doing them with [place] and have her contact patient in regard. Patient stated understanding

## 2019-04-25 NOTE — Telephone Encounter (Signed)
I am not sure perhaps. I do not think there are any issues with [facility] medicare with being seen here.

## 2019-04-25 NOTE — Telephone Encounter (Signed)
Copied from [place] [id number]. Topic: General - Inquiry >> Apr 23, 2019 10:45 AM Alease Frame wrote: Reason for CRM: Patient calling in to make sure office has a copy of Home Physical therapy report and that patient can still be seen here with [facility] insurance. Please advise.

## 2019-04-25 NOTE — Telephone Encounter (Signed)
Called and spoke to patient regarding AWV. An appointment is scheduled after her physical on 06/19/19 to do AWV. Patient was appreciative for the callback.

## 2019-04-25 NOTE — Telephone Encounter (Signed)
Have you seen any PT notes for patient?

## 2019-06-19 ENCOUNTER — Ambulatory Visit (INDEPENDENT_AMBULATORY_CARE_PROVIDER_SITE_OTHER): Payer: Medicare Other | Admitting: Internal Medicine

## 2019-06-19 ENCOUNTER — Ambulatory Visit: Payer: Medicare Other | Attending: Internal Medicine

## 2019-06-19 ENCOUNTER — Ambulatory Visit: Payer: Medicare Other

## 2019-06-19 ENCOUNTER — Encounter: Payer: Self-pay | Admitting: Internal Medicine

## 2019-06-19 ENCOUNTER — Other Ambulatory Visit: Payer: Self-pay

## 2019-06-19 VITALS — BP 120/78 | HR 79 | Temp 97.9°F | Ht 65.0 in | Wt 109.0 lb

## 2019-06-19 DIAGNOSIS — N393 Stress incontinence (female) (male): Secondary | ICD-10-CM

## 2019-06-19 DIAGNOSIS — Z23 Encounter for immunization: Secondary | ICD-10-CM | POA: Insufficient documentation

## 2019-06-19 DIAGNOSIS — H3530 Unspecified macular degeneration: Secondary | ICD-10-CM | POA: Diagnosis not present

## 2019-06-19 DIAGNOSIS — Z0000 Encounter for general adult medical examination without abnormal findings: Secondary | ICD-10-CM

## 2019-06-19 DIAGNOSIS — R32 Unspecified urinary incontinence: Secondary | ICD-10-CM | POA: Insufficient documentation

## 2019-06-19 DIAGNOSIS — M8580 Other specified disorders of bone density and structure, unspecified site: Secondary | ICD-10-CM

## 2019-06-19 LAB — LIPID PANEL
Cholesterol: 182 mg/dL (ref 0–200)
HDL: 82.2 mg/dL (ref >39.00)
LDL Cholesterol: 85 mg/dL (ref 0–99)
NonHDL: 99.41
Total CHOL/HDL Ratio: 2
Triglycerides: 70 mg/dL (ref 0.0–149.0)
VLDL: 14 mg/dL (ref 0.0–40.0)

## 2019-06-19 LAB — CBC
HCT: 43 % (ref 36.0–46.0)
Hemoglobin: 14.1 g/dL (ref 12.0–15.0)
MCHC: 32.8 g/dL (ref 30.0–36.0)
MCV: 91.9 fl (ref 78.0–100.0)
Platelets: 219 10*3/uL (ref 150.0–400.0)
RBC: 4.68 Mil/uL (ref 3.87–5.11)
RDW: 14.1 % (ref 11.5–15.5)
WBC: 6 10*3/uL (ref 4.0–10.5)

## 2019-06-19 LAB — COMPREHENSIVE METABOLIC PANEL
ALT: 12 U/L (ref 0–35)
AST: 15 U/L (ref 0–37)
Albumin: 4.1 g/dL (ref 3.5–5.2)
Alkaline Phosphatase: 52 U/L (ref 39–117)
BUN: 24 mg/dL — ABNORMAL HIGH (ref 6–23)
CO2: 31 mEq/L (ref 19–32)
Calcium: 9.1 mg/dL (ref 8.4–10.5)
Chloride: 104 mEq/L (ref 96–112)
Creatinine, Ser: 0.88 mg/dL (ref 0.40–1.20)
GFR: 61.26 mL/min (ref >60.00)
Glucose, Bld: 103 mg/dL — ABNORMAL HIGH (ref 70–99)
Potassium: 3.9 mEq/L (ref 3.5–5.1)
Sodium: 141 mEq/L (ref 135–145)
Total Bilirubin: 0.6 mg/dL (ref 0.2–1.2)
Total Protein: 6.7 g/dL (ref 6.0–8.3)

## 2019-06-19 NOTE — Progress Notes (Signed)
   Covid-19 Vaccination Clinic  Name:  Terry Robbins    MRN: [id number] DOB: [birth year]  06/19/2019  [name] was observed post Covid-19 immunization for 15 minutes without incidence. She was provided with Vaccine Information Sheet and instruction to access the V-Safe system.   [name] was instructed to call 911 with any severe reactions post vaccine: Marland Kitchen Difficulty breathing  . Swelling of your face and throat  . A fast heartbeat  . A bad rash all over your body  . Dizziness and weakness    Immunizations Administered    Name Date Dose VIS Date Route   Pfizer COVID-19 Vaccine 06/19/2019 10:25 AM 0.3 mL 05/18/2019 Intramuscular   Manufacturer: [employer], [employer]   Lot: [id number]   NDC: [address]-7

## 2019-06-19 NOTE — Patient Instructions (Addendum)
Benadryl you can take as needed for the itching. Another option which does not make you drowsy is claritin (loratadine) that you can take up to 3 times a day for itching.    Health Maintenance, Female Adopting a healthy lifestyle and getting preventive care are important in promoting health and wellness. Ask your health care provider about:  The right schedule for you to have regular tests and exams.  Things you can do on your own to prevent diseases and keep yourself healthy. What should I know about diet, weight, and exercise? Eat a healthy diet   Eat a diet that includes plenty of vegetables, fruits, low-fat dairy products, and lean protein.  Do not eat a lot of foods that are high in solid fats, added sugars, or sodium. Maintain a healthy weight Body mass index (BMI) is used to identify weight problems. It estimates body fat based on height and weight. Your health care provider can help determine your BMI and help you achieve or maintain a healthy weight. Get regular exercise Get regular exercise. This is one of the most important things you can do for your health. Most adults should:  Exercise for at least 150 minutes each week. The exercise should increase your heart rate and make you sweat (moderate-intensity exercise).  Do strengthening exercises at least twice a week. This is in addition to the moderate-intensity exercise.  Spend less time sitting. Even light physical activity can be beneficial. Watch cholesterol and blood lipids Have your blood tested for lipids and cholesterol at 84 years of age, then have this test every 5 years. Have your cholesterol levels checked more often if:  Your lipid or cholesterol levels are high.  You are older than 84 years of age.  You are at high risk for heart disease. What should I know about cancer screening? Depending on your health history and family history, you may need to have cancer screening at various ages. This may include  screening for:  Breast cancer.  Cervical cancer.  Colorectal cancer.  Skin cancer.  Lung cancer. What should I know about heart disease, diabetes, and high blood pressure? Blood pressure and heart disease  High blood pressure causes heart disease and increases the risk of stroke. This is more likely to develop in people who have high blood pressure readings, are of African descent, or are overweight.  Have your blood pressure checked: ? Every 3-5 years if you are 42-15 years of age. ? Every year if you are 29 years old or older. Diabetes Have regular diabetes screenings. This checks your fasting blood sugar level. Have the screening done:  Once every three years after age 48 if you are at a normal weight and have a low risk for diabetes.  More often and at a younger age if you are overweight or have a high risk for diabetes. What should I know about preventing infection? Hepatitis B If you have a higher risk for hepatitis B, you should be screened for this virus. Talk with your health care provider to find out if you are at risk for hepatitis B infection. Hepatitis C Testing is recommended for:  Everyone born from [birth year] through 1963.  Anyone with known risk factors for hepatitis C. Sexually transmitted infections (STIs)  Get screened for STIs, including gonorrhea and chlamydia, if: ? You are sexually active and are younger than 84 years of age. ? You are older than 84 years of age and your health care provider tells you that you  are at risk for this type of infection. ? Your sexual activity has changed since you were last screened, and you are at increased risk for chlamydia or gonorrhea. Ask your health care provider if you are at risk.  Ask your health care provider about whether you are at high risk for HIV. Your health care provider may recommend a prescription medicine to help prevent HIV infection. If you choose to take medicine to prevent HIV, you should first get  tested for HIV. You should then be tested every 3 months for as long as you are taking the medicine. Pregnancy  If you are about to stop having your period (premenopausal) and you may become pregnant, seek counseling before you get pregnant.  Take 400 to 800 micrograms (mcg) of folic acid every day if you become pregnant.  Ask for birth control (contraception) if you want to prevent pregnancy. Osteoporosis and menopause Osteoporosis is a disease in which the bones lose minerals and strength with aging. This can result in bone fractures. If you are 40 years old or older, or if you are at risk for osteoporosis and fractures, ask your health care provider if you should:  Be screened for bone loss.  Take a calcium or vitamin D supplement to lower your risk of fractures.  Be given hormone replacement therapy (HRT) to treat symptoms of menopause. Follow these instructions at home: Lifestyle  Do not use any products that contain nicotine or tobacco, such as cigarettes, e-cigarettes, and chewing tobacco. If you need help quitting, ask your health care provider.  Do not use street drugs.  Do not share needles.  Ask your health care provider for help if you need support or information about quitting drugs. Alcohol use  Do not drink alcohol if: ? Your health care provider tells you not to drink. ? You are pregnant, may be pregnant, or are planning to become pregnant.  If you drink alcohol: ? Limit how much you use to 0-1 drink a day. ? Limit intake if you are breastfeeding.  Be aware of how much alcohol is in your drink. In the [place] one drink equals one 12 oz bottle of beer (355 mL), one 5 oz glass of wine (148 mL), or one 1 oz glass of hard liquor (44 mL). General instructions  Schedule regular health, dental, and eye exams.  Stay current with your vaccines.  Tell your health care provider if: ? You often feel depressed. ? You have ever been abused or do not feel safe at  home. Summary  Adopting a healthy lifestyle and getting preventive care are important in promoting health and wellness.  Follow your health care provider's instructions about healthy diet, exercising, and getting tested or screened for diseases.  Follow your health care provider's instructions on monitoring your cholesterol and blood pressure. This information is not intended to replace advice given to you by your health care provider. Make sure you discuss any questions you have with your health care provider. Document Revised: 05/17/2018 Document Reviewed: 05/17/2018 Elsevier Patient Education  2020 [employer].

## 2019-06-19 NOTE — Assessment & Plan Note (Signed)
Due dexa later this year or next year. Taking evista.

## 2019-06-19 NOTE — Assessment & Plan Note (Signed)
Flu shot up to date. Pneumonia complete. Shingrix counseled. Tetanus due 2026. Colonoscopy aged out. Mammogram aged out, pap smear aged out and dexa due later 2021 or 2022. Counseled about sun safety and mole surveillance. Counseled about the dangers of distracted driving. Given 10 year screening recommendations.

## 2019-06-19 NOTE — Assessment & Plan Note (Signed)
Taking myrbetriq with good relief.

## 2019-06-19 NOTE — Progress Notes (Signed)
Subjective:   Patient ID: Terry Robbins, female    DOB: Oct 16, 1933, 86 y.o.   MRN: [id number]  HPI Here for medicare wellness and physical, no new complaints. Please see A/P for status and treatment of chronic medical problems.   Diet: heart healthy Physical activity: sedentary Depression/mood screen: negative Hearing: intact to whispered voice with bilateral aids Visual acuity: grossly normal with lens, macular degeneration both eyes, performs annual eye exam  ADLs: capable Fall risk: none Home safety: good Cognitive evaluation: intact to orientation, naming, recall and repetition EOL planning: adv directives discussed    Office Visit from 06/19/2019 in [facility] Total Score  0      I have personally reviewed and have noted 1. The patient's medical and social history - reviewed today no changes 2. Their use of alcohol, tobacco or illicit drugs 3. Their current medications and supplements 4. The patient's functional ability including ADL's, fall risks, home safety risks and hearing or visual impairment. 5. Diet and physical activities 6. Evidence for depression or mood disorders 7. Care team reviewed and updated  Patient Care Team: Myrlene Broker, MD as PCP - General (Internal Medicine) Maris Berger, MD as Consulting Physician (Ophthalmology) Carman Ching, MD (Gastroenterology) Past Medical History:  Diagnosis Date  . ARTHRITIS   . Empyema, left (HCC) 04/2010   related to PNA; s/p VATS/minithoracotomy & bronch  . MACULAR DEGENERATION   . OA (osteoarthritis)   . OSTEOPOROSIS   . Psoriasis    Past Surgical History:  Procedure Laterality Date  . Ganglion cyst (L) thumb    . MOUTH SURGERY    . VATS/Minithracotomy and bronch  04/2010   Family History  Problem Relation Age of Onset  . Diabetes Other        Parent  . Hypertension Other        parent  . Dementia Other        Parent  . Lung cancer Other        Parent    . Breast cancer Neg Hx    Review of Systems  Constitutional: Negative.   HENT: Negative.   Eyes: Negative.   Respiratory: Negative for cough, chest tightness and shortness of breath.   Cardiovascular: Negative for chest pain, palpitations and leg swelling.  Gastrointestinal: Negative for abdominal distention, abdominal pain, constipation, diarrhea, nausea and vomiting.  Musculoskeletal: Negative.   Skin: Negative.   Neurological: Negative.   Psychiatric/Behavioral: Negative.     Objective:  Physical Exam Constitutional:      Appearance: She is well-developed.     Comments: thin  HENT:     Head: Normocephalic and atraumatic.  Cardiovascular:     Rate and Rhythm: Normal rate and regular rhythm.  Pulmonary:     Effort: Pulmonary effort is normal. No respiratory distress.     Breath sounds: Normal breath sounds. No wheezing or rales.  Abdominal:     General: Bowel sounds are normal. There is no distension.     Palpations: Abdomen is soft.     Tenderness: There is no abdominal tenderness. There is no rebound.  Musculoskeletal:     Cervical back: Normal range of motion.  Skin:    General: Skin is warm and dry.  Neurological:     Mental Status: She is alert and oriented to person, place, and time.     Coordination: Coordination normal.     Vitals:   06/19/19 0903  BP: 120/78  Pulse: 79  Temp: 97.9 F (36.6 C)  TempSrc: Oral  SpO2: 96%  Weight: 109 lb (49.4 kg)  Height: 5\' 5"  (1.651 m)    This visit occurred during the SARS-CoV-2 public health emergency.  Safety protocols were in place, including screening questions prior to the visit, additional usage of staff PPE, and extensive cleaning of exam room while observing appropriate contact time as indicated for disinfecting solutions.   Assessment & Plan:

## 2019-06-19 NOTE — Assessment & Plan Note (Signed)
Still seeing eye specialist and under treatment. Vision adequate at this time.

## 2019-07-09 ENCOUNTER — Ambulatory Visit: Payer: Medicare Other | Attending: Internal Medicine

## 2019-07-09 DIAGNOSIS — Z23 Encounter for immunization: Secondary | ICD-10-CM | POA: Insufficient documentation

## 2019-07-09 NOTE — Progress Notes (Signed)
   Covid-19 Vaccination Clinic  Name:  KARLA PAVONE    MRN: [id number] DOB: [birth year]  07/09/2019  Ms. Sedlak was observed post Covid-19 immunization for 15 minutes without incidence. She was provided with Vaccine Information Sheet and instruction to access the V-Safe system.   Ms. Francom was instructed to call 911 with any severe reactions post vaccine: Marland Kitchen Difficulty breathing  . Swelling of your face and throat  . A fast heartbeat  . A bad rash all over your body  . Dizziness and weakness    Immunizations Administered    Name Date Dose VIS Date Route   Pfizer COVID-19 Vaccine 07/09/2019  9:49 AM 0.3 mL 05/18/2019 Intramuscular   Manufacturer: [employer], [employer]   Lot: [id number]   NDC: [address]-4

## 2019-07-11 ENCOUNTER — Other Ambulatory Visit: Payer: Self-pay | Admitting: Internal Medicine

## 2019-07-11 DIAGNOSIS — Z1231 Encounter for screening mammogram for malignant neoplasm of breast: Secondary | ICD-10-CM

## 2019-07-16 ENCOUNTER — Telehealth: Payer: Self-pay

## 2019-07-16 NOTE — Telephone Encounter (Signed)
New message   The patient wants Dr. Okey Dupre opinion   C/o sinus & nose drip.   recent COVID vaccine

## 2019-07-17 NOTE — Telephone Encounter (Signed)
That's fine

## 2019-07-18 ENCOUNTER — Encounter: Payer: Self-pay | Admitting: Internal Medicine

## 2019-07-18 ENCOUNTER — Ambulatory Visit (INDEPENDENT_AMBULATORY_CARE_PROVIDER_SITE_OTHER): Payer: Medicare Other | Admitting: Internal Medicine

## 2019-07-18 DIAGNOSIS — Z7189 Other specified counseling: Secondary | ICD-10-CM

## 2019-07-18 NOTE — Progress Notes (Signed)
Virtual Visit via Video Note  I connected with Terry Robbins on 07/18/19 at  9:00 AM EST by a video enabled telemedicine application and verified that I am speaking with the correct person using two identifiers.  The patient and the provider were at separate locations throughout the entire encounter.   I discussed the limitations of evaluation and management by telemedicine and the availability of in person appointments. The patient expressed understanding and agreed to proceed. The patient and the provider were the only parties present for the visit unless noted in HPI below.  History of Present Illness: The patient is a 86 y.o. female with visit for clear sinus drainage for several days after second covid-19 vaccine. She denies fevers or chills. Today she has not had any drainage. Denies cough or SOB. Did not take anything for it. Started a few hours after getting the shot. Overall it is improving. Has tried nothing  Observations/Objective: Appearance: normal, breathing appears normal, hard of hearing, casual grooming, abdomen does not appear distended, throat normal, memory normal, mental status is A and O times 3  Assessment and Plan: See problem oriented charting  Follow Up Instructions: observe, could have been related to the covid-19 vaccine, counseled that second vaccine is more likely to produce a clinical reaction  I discussed the assessment and treatment plan with the patient. The patient was provided an opportunity to ask questions and all were answered. The patient agreed with the plan and demonstrated an understanding of the instructions.   The patient was advised to call back or seek an in-person evaluation if the symptoms worsen or if the condition fails to improve as anticipated.  Myrlene Broker, MD

## 2019-07-18 NOTE — Assessment & Plan Note (Signed)
Suspect her symptoms are related to vaccine administration. She was reminded that full immunity takes about 2 weeks after second vaccine administration. As symptoms are resolving no further action is needed.

## 2019-08-03 ENCOUNTER — Ambulatory Visit
Admission: RE | Admit: 2019-08-03 | Discharge: 2019-08-03 | Disposition: A | Discharge status: Home / Self Care | Payer: Medicare Other | Source: Ambulatory Visit | Attending: Internal Medicine | Admitting: Internal Medicine

## 2019-08-03 ENCOUNTER — Other Ambulatory Visit: Payer: Self-pay

## 2019-08-03 DIAGNOSIS — Z1231 Encounter for screening mammogram for malignant neoplasm of breast: Secondary | ICD-10-CM

## 2019-08-13 ENCOUNTER — Other Ambulatory Visit: Payer: Self-pay | Admitting: Internal Medicine

## 2019-08-13 DIAGNOSIS — M79645 Pain in left finger(s): Secondary | ICD-10-CM

## 2019-08-13 DIAGNOSIS — G8929 Other chronic pain: Secondary | ICD-10-CM

## 2020-01-01 ENCOUNTER — Encounter: Payer: Self-pay | Admitting: Internal Medicine

## 2020-01-01 ENCOUNTER — Ambulatory Visit: Payer: Medicare Other | Admitting: Internal Medicine

## 2020-01-01 ENCOUNTER — Other Ambulatory Visit: Payer: Self-pay

## 2020-01-01 VITALS — BP 126/86 | HR 74 | Temp 98.2°F | Ht 65.0 in | Wt 113.0 lb

## 2020-01-01 DIAGNOSIS — F411 Generalized anxiety disorder: Secondary | ICD-10-CM | POA: Diagnosis not present

## 2020-01-01 NOTE — Progress Notes (Signed)
   Subjective:   Patient ID: Terry Robbins, female    DOB: 09-19-1933, 86 y.o.   MRN: [id number]  HPI The patient is an 86 YO female coming in for concerns about anxiety and stress. She is trying to figure out if she should move to be closer to family in [place]. She is alone in her home and would like more companionship close by. Her neighbors are nice but busy with their own lives. She denies SI/HI. This is occupying her emotional time and energy and she is not sleeping well. Her niece was trying to get a house and charge her rent to live there and this did not sit well with her. She was going to have to pay 1200 per month and her current home is paid off. She talked to her accountant and they decided she would need to see this place in [place] and so that fell through. There is some family tension over this currently. She does not like how her relatives treated her through this. Is still seeking a solution. Is using prayer as a stress reliever. This was initially causing her some stomach pain and discomfort with diarrhea but this is resolving at the current time. Denies current diarrhea, constipation, blood in stool, nausea or vomiting.   Review of Systems  Constitutional: Negative.   HENT: Negative.   Eyes: Negative.   Respiratory: Negative for cough, chest tightness and shortness of breath.   Cardiovascular: Negative for chest pain, palpitations and leg swelling.  Gastrointestinal: Negative for abdominal distention, abdominal pain, constipation, diarrhea, nausea and vomiting.  Musculoskeletal: Negative.   Skin: Negative.   Neurological: Negative.   Psychiatric/Behavioral: Positive for decreased concentration, dysphoric mood and sleep disturbance. The patient is nervous/anxious.     Objective:  Physical Exam Constitutional:      Appearance: She is well-developed.  HENT:     Head: Normocephalic and atraumatic.  Cardiovascular:     Rate and Rhythm: Normal rate and regular rhythm.    Pulmonary:     Effort: Pulmonary effort is normal. No respiratory distress.     Breath sounds: Normal breath sounds. No wheezing or rales.  Abdominal:     General: Bowel sounds are normal. There is no distension.     Palpations: Abdomen is soft.     Tenderness: There is no abdominal tenderness. There is no rebound.  Musculoskeletal:     Cervical back: Normal range of motion.  Skin:    General: Skin is warm and dry.  Neurological:     Mental Status: She is alert and oriented to person, place, and time.     Coordination: Coordination normal.     Vitals:   01/01/20 1439  BP: (!) 126/86  Pulse: 74  Temp: 98.2 F (36.8 C)  TempSrc: Oral  SpO2: 96%  Weight: 113 lb (51.3 kg)  Height: 5\' 5"  (1.651 m)   This visit occurred during the SARS-CoV-2 public health emergency.  Safety protocols were in place, including screening questions prior to the visit, additional usage of staff PPE, and extensive cleaning of exam room while observing appropriate contact time as indicated for disinfecting solutions.   Assessment & Plan:  Visit time 25 minutes in face to face communication with patient and coordination of care, additional 5 minutes spent in record review, coordination or care, ordering tests, communicating/referring to other healthcare professionals, documenting in medical records all on the same day of the visit for total time 30 minutes spent on the visit.

## 2020-01-01 NOTE — Patient Instructions (Signed)
Keep track of your stomach and let us know how you are doing.

## 2020-01-03 ENCOUNTER — Other Ambulatory Visit: Payer: Self-pay

## 2020-01-03 ENCOUNTER — Ambulatory Visit: Payer: Medicare Other | Admitting: Internal Medicine

## 2020-01-03 ENCOUNTER — Encounter: Payer: Self-pay | Admitting: Internal Medicine

## 2020-01-03 DIAGNOSIS — L989 Disorder of the skin and subcutaneous tissue, unspecified: Secondary | ICD-10-CM

## 2020-01-03 DIAGNOSIS — F411 Generalized anxiety disorder: Secondary | ICD-10-CM

## 2020-01-03 NOTE — Progress Notes (Signed)
   Subjective:   Patient ID: Terry Robbins, female    DOB: 1934-01-26, 86 y.o.   MRN: [id number]  HPI The patient is an 86 YO female coming in for concerns about a mole on her face. Present for years. No growth or pain or itching. Skin tone color without change over the years. Overall may be shrinking slightly per her estimation. Also having a lot of stress due to potential move in her near future and out of state family members trying to influence her decision and make decisions based on their needs and wants rather than hers.   Review of Systems  Constitutional: Negative.   HENT: Negative.   Eyes: Negative.   Respiratory: Negative for cough, chest tightness and shortness of breath.   Cardiovascular: Negative for chest pain, palpitations and leg swelling.  Gastrointestinal: Negative for abdominal distention, abdominal pain, constipation, diarrhea, nausea and vomiting.  Musculoskeletal: Negative.   Skin: Negative.   Neurological: Negative.   Psychiatric/Behavioral: The patient is nervous/anxious.     Objective:  Physical Exam Constitutional:      Appearance: She is well-developed.  HENT:     Head: Normocephalic and atraumatic.  Cardiovascular:     Rate and Rhythm: Normal rate and regular rhythm.  Pulmonary:     Effort: Pulmonary effort is normal. No respiratory distress.     Breath sounds: Normal breath sounds. No wheezing or rales.  Abdominal:     General: Bowel sounds are normal. There is no distension.     Palpations: Abdomen is soft.     Tenderness: There is no abdominal tenderness. There is no rebound.  Musculoskeletal:     Cervical back: Normal range of motion.  Skin:    General: Skin is warm and dry.     Comments: Skin lesion on right cheek consistent with benign pathology  Neurological:     Mental Status: She is alert and oriented to person, place, and time.     Coordination: Coordination normal.     Vitals:   01/03/20 1033  BP: 116/72  Pulse: 79  Temp: 97.9  F (36.6 C)  TempSrc: Oral  SpO2: 96%  Weight: 104 lb (47.2 kg)  Height: 5\' 5"  (1.651 m)    This visit occurred during the SARS-CoV-2 public health emergency.  Safety protocols were in place, including screening questions prior to the visit, additional usage of staff PPE, and extensive cleaning of exam room while observing appropriate contact time as indicated for disinfecting solutions.   Assessment & Plan:  Visit time 20 minutes in face to face communication with patient and coordination of care, additional 0 minutes spent in record review, coordination or care, ordering tests, communicating/referring to other healthcare professionals, documenting in medical records all on the same day of the visit for total time 20 minutes spent on the visit.

## 2020-01-03 NOTE — Assessment & Plan Note (Signed)
Not cancerous appearing and okay for close monitoring. Right cheek.

## 2020-01-03 NOTE — Assessment & Plan Note (Signed)
Counseled about her potential move and to ensure that she is making a good decision for herself as well as stress relieving strategies for her to use at home.

## 2020-01-03 NOTE — Assessment & Plan Note (Signed)
Over the family relationship as well as which direction is going to be good for her in the future with housing. We talked about different options as well as stress relieving options. She does not want to try medication at this time. She is using prayer and belief that the good lord will work this all out somehow.

## 2020-04-05 ENCOUNTER — Other Ambulatory Visit: Payer: Self-pay | Admitting: Internal Medicine

## 2020-04-05 DIAGNOSIS — M79645 Pain in left finger(s): Secondary | ICD-10-CM

## 2020-06-19 ENCOUNTER — Telehealth: Payer: Self-pay | Admitting: Internal Medicine

## 2020-06-19 NOTE — Telephone Encounter (Signed)
LVM for pt to rtn my call to schedule awv with [facility]. Please schedule awv if pt calls the office.  

## 2020-07-18 ENCOUNTER — Ambulatory Visit (INDEPENDENT_AMBULATORY_CARE_PROVIDER_SITE_OTHER): Payer: Medicare Other | Admitting: Internal Medicine

## 2020-07-18 ENCOUNTER — Encounter: Payer: Self-pay | Admitting: Internal Medicine

## 2020-07-18 ENCOUNTER — Other Ambulatory Visit: Payer: Self-pay

## 2020-07-18 DIAGNOSIS — H3530 Unspecified macular degeneration: Secondary | ICD-10-CM | POA: Diagnosis not present

## 2020-07-18 NOTE — Assessment & Plan Note (Signed)
Advised to bring the eye portion of the paperwork to her eye specialist. Other papers are filled out and ready to be returned once eye portion is done. Given back to patient at visit.

## 2020-07-18 NOTE — Patient Instructions (Signed)
We have filled out the forms for the driving. If you get any more forms for [place] you can drop them off and we can fill those out for you.

## 2020-07-18 NOTE — Progress Notes (Signed)
   Subjective:   Patient ID: Terry Robbins, female    DOB: [birth year], 87 y.o.   MRN: [id number]  HPI Needs general fitness for driving exam. Denies significant problems and no recent accidents. She is getting ready to move to [place] to be near family and is trying to tie up some loose ends. Denies chest pains or SOB. Denies significant pain or diarrhea or constipation. No blood in stool.   Review of Systems  Constitutional: Negative.   HENT: Negative.   Eyes: Negative.   Respiratory: Negative for cough, chest tightness and shortness of breath.   Cardiovascular: Negative for chest pain, palpitations and leg swelling.  Gastrointestinal: Negative for abdominal distention, abdominal pain, constipation, diarrhea, nausea and vomiting.  Musculoskeletal: Negative.   Skin: Negative.   Neurological: Negative.   Psychiatric/Behavioral: Negative.     Objective:  Physical Exam Constitutional:      Appearance: She is well-developed and well-nourished.  HENT:     Head: Normocephalic and atraumatic.  Eyes:     Extraocular Movements: EOM normal.  Cardiovascular:     Rate and Rhythm: Normal rate and regular rhythm.  Pulmonary:     Effort: Pulmonary effort is normal. No respiratory distress.     Breath sounds: Normal breath sounds. No wheezing or rales.  Abdominal:     General: Bowel sounds are normal. There is no distension.     Palpations: Abdomen is soft.     Tenderness: There is no abdominal tenderness. There is no rebound.  Musculoskeletal:        General: No edema.     Cervical back: Normal range of motion.  Skin:    General: Skin is warm and dry.  Neurological:     Mental Status: She is alert and oriented to person, place, and time.     Coordination: Coordination normal.  Psychiatric:        Mood and Affect: Mood and affect normal.     Vitals:   07/18/20 1014  BP: 112/70  Pulse: 69  Resp: 18  Temp: 97.9 F (36.6 C)  TempSrc: Oral  SpO2: 98%  Weight: 101 lb 9.6 oz  (46.1 kg)  Height: 5\' 5"  (1.651 m)    This visit occurred during the SARS-CoV-2 public health emergency.  Safety protocols were in place, including screening questions prior to the visit, additional usage of staff PPE, and extensive cleaning of exam room while observing appropriate contact time as indicated for disinfecting solutions.   Assessment & Plan:  Visit time 15 minutes in face to face communication with patient and coordination of care, additional 5 minutes spent in record review, coordination or care, ordering tests, communicating/referring to other healthcare professionals, documenting in medical records all on the same day of the visit for total time 20 minutes spent on the visit.

## 2020-07-21 ENCOUNTER — Telehealth: Payer: Self-pay | Admitting: Internal Medicine

## 2020-07-21 NOTE — Telephone Encounter (Signed)
Patients nephew called and wanted to discuss the patients recent OV on 2.11.22. he can be reached at [phone]. Please advise.

## 2020-07-24 NOTE — Telephone Encounter (Signed)
Patients nephew calling back to check to see when someone could call him to discuss the OV

## 2020-07-24 NOTE — Telephone Encounter (Signed)
Unable to speak with the nephew. No current DPR on file. New DPR  must be filled out.

## 2020-07-25 ENCOUNTER — Telehealth: Payer: Self-pay | Admitting: Internal Medicine

## 2020-07-25 NOTE — Telephone Encounter (Signed)
The nephew Annette Stable is calling to request home health orders for patient. He states she needs assistance at home No preference of agency   Please call [employer]

## 2020-07-30 NOTE — Telephone Encounter (Signed)
If nephew is not on DPR we cannot talk to him about this patient. If the patient requests change to DPR she can fill out this.

## 2020-07-30 NOTE — Telephone Encounter (Signed)
See below. I didn't see a updated DPR with the nephew's name on it. Please advise

## 2020-07-31 NOTE — Telephone Encounter (Signed)
LVM for the patient to return my call here at the office. Office number was provided.  

## 2020-10-06 ENCOUNTER — Other Ambulatory Visit: Payer: Self-pay | Admitting: Internal Medicine

## 2020-10-06 DIAGNOSIS — G8929 Other chronic pain: Secondary | ICD-10-CM

## 2020-10-06 DIAGNOSIS — M79645 Pain in left finger(s): Secondary | ICD-10-CM

## 2021-07-02 ENCOUNTER — Telehealth: Payer: Self-pay | Admitting: Internal Medicine

## 2021-07-02 NOTE — Telephone Encounter (Signed)
LVM for pt to rtn my call to schedule AWV with [facility]. Please schedule this appt if pt calls the office.  °
